# Patient Record
Sex: Male | Born: 2000
Health system: Southern US, Community
[De-identification: ages and names within clinical notes are randomized; demographics above are authoritative.]

## PROBLEM LIST (undated history)

## (undated) DIAGNOSIS — E119 Type 2 diabetes mellitus without complications: Secondary | ICD-10-CM

## (undated) DIAGNOSIS — K219 Gastro-esophageal reflux disease without esophagitis: Secondary | ICD-10-CM

## (undated) DIAGNOSIS — I10 Essential (primary) hypertension: Secondary | ICD-10-CM

## (undated) DIAGNOSIS — F909 Attention-deficit hyperactivity disorder, unspecified type: Secondary | ICD-10-CM

## (undated) DIAGNOSIS — K760 Fatty (change of) liver, not elsewhere classified: Secondary | ICD-10-CM

## (undated) HISTORY — DX: Gastro-esophageal reflux disease without esophagitis: K21.9

## (undated) HISTORY — PX: TONSILLECTOMY: SUR1361

---
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
## 2019-02-08 ENCOUNTER — Other Ambulatory Visit: Payer: Self-pay

## 2019-02-08 ENCOUNTER — Emergency Department (HOSPITAL_BASED_OUTPATIENT_CLINIC_OR_DEPARTMENT_OTHER)
Admission: EM | Admit: 2019-02-08 | Discharge: 2019-02-08 | Disposition: A | Discharge status: Home / Self Care | Payer: 59 | Attending: Emergency Medicine | Admitting: Emergency Medicine

## 2019-02-08 ENCOUNTER — Emergency Department (HOSPITAL_BASED_OUTPATIENT_CLINIC_OR_DEPARTMENT_OTHER): Payer: 59

## 2019-02-08 ENCOUNTER — Encounter (HOSPITAL_BASED_OUTPATIENT_CLINIC_OR_DEPARTMENT_OTHER): Payer: Self-pay

## 2019-02-08 DIAGNOSIS — R630 Anorexia: Secondary | ICD-10-CM | POA: Insufficient documentation

## 2019-02-08 DIAGNOSIS — R197 Diarrhea, unspecified: Secondary | ICD-10-CM | POA: Diagnosis not present

## 2019-02-08 DIAGNOSIS — M549 Dorsalgia, unspecified: Secondary | ICD-10-CM | POA: Diagnosis not present

## 2019-02-08 DIAGNOSIS — Z7984 Long term (current) use of oral hypoglycemic drugs: Secondary | ICD-10-CM | POA: Diagnosis not present

## 2019-02-08 DIAGNOSIS — R300 Dysuria: Secondary | ICD-10-CM | POA: Insufficient documentation

## 2019-02-08 DIAGNOSIS — E119 Type 2 diabetes mellitus without complications: Secondary | ICD-10-CM | POA: Insufficient documentation

## 2019-02-08 DIAGNOSIS — R112 Nausea with vomiting, unspecified: Secondary | ICD-10-CM | POA: Diagnosis not present

## 2019-02-08 DIAGNOSIS — R1011 Right upper quadrant pain: Secondary | ICD-10-CM | POA: Insufficient documentation

## 2019-02-08 DIAGNOSIS — R1010 Upper abdominal pain, unspecified: Secondary | ICD-10-CM

## 2019-02-08 HISTORY — DX: Fatty (change of) liver, not elsewhere classified: K76.0

## 2019-02-08 HISTORY — DX: Morbid (severe) obesity due to excess calories: E66.01

## 2019-02-08 LAB — COMPREHENSIVE METABOLIC PANEL
ALT: 138 U/L — ABNORMAL HIGH (ref 0–44)
AST: 60 U/L — ABNORMAL HIGH (ref 15–41)
Albumin: 4 g/dL (ref 3.5–5.0)
Alkaline Phosphatase: 73 U/L (ref 38–126)
Anion gap: 10 (ref 5–15)
BUN: 12 mg/dL (ref 6–20)
CO2: 21 mmol/L — ABNORMAL LOW (ref 22–32)
Calcium: 9.1 mg/dL (ref 8.9–10.3)
Chloride: 102 mmol/L (ref 98–111)
Creatinine, Ser: 0.65 mg/dL (ref 0.61–1.24)
GFR calc Af Amer: >60 mL/min (ref >60)
GFR calc non Af Amer: >60 mL/min (ref >60)
Glucose, Bld: 282 mg/dL — ABNORMAL HIGH (ref 70–99)
Potassium: 4.1 mmol/L (ref 3.5–5.1)
Sodium: 133 mmol/L — ABNORMAL LOW (ref 135–145)
Total Bilirubin: 0.3 mg/dL (ref 0.3–1.2)
Total Protein: 6.9 g/dL (ref 6.5–8.1)

## 2019-02-08 LAB — URINALYSIS, ROUTINE W REFLEX MICROSCOPIC
Bilirubin Urine: NEGATIVE
Glucose, UA: >=500 mg/dL — AB
Hgb urine dipstick: NEGATIVE
Ketones, ur: NEGATIVE mg/dL
Leukocytes,Ua: NEGATIVE
Nitrite: NEGATIVE
Protein, ur: NEGATIVE mg/dL
Specific Gravity, Urine: 1.02 (ref 1.005–1.030)
pH: 7 (ref 5.0–8.0)

## 2019-02-08 LAB — CBC WITH DIFFERENTIAL/PLATELET
Abs Immature Granulocytes: 0.03 10*3/uL (ref 0.00–0.07)
Basophils Absolute: 0 10*3/uL (ref 0.0–0.1)
Basophils Relative: 0 %
Eosinophils Absolute: 0 10*3/uL (ref 0.0–0.5)
Eosinophils Relative: 0 %
HCT: 44 % (ref 39.0–52.0)
Hemoglobin: 14.2 g/dL (ref 13.0–17.0)
Immature Granulocytes: 0 %
Lymphocytes Relative: 23 %
Lymphs Abs: 2 10*3/uL (ref 0.7–4.0)
MCH: 27 pg (ref 26.0–34.0)
MCHC: 32.3 g/dL (ref 30.0–36.0)
MCV: 83.8 fL (ref 80.0–100.0)
Monocytes Absolute: 0.5 10*3/uL (ref 0.1–1.0)
Monocytes Relative: 6 %
Neutro Abs: 6.2 10*3/uL (ref 1.7–7.7)
Neutrophils Relative %: 71 %
Platelets: 225 10*3/uL (ref 150–400)
RBC: 5.25 MIL/uL (ref 4.22–5.81)
RDW: 12.3 % (ref 11.5–15.5)
WBC: 8.8 10*3/uL (ref 4.0–10.5)
nRBC: 0 % (ref 0.0–0.2)

## 2019-02-08 LAB — URINALYSIS, MICROSCOPIC (REFLEX)
RBC / HPF: NONE SEEN RBC/hpf (ref 0–5)
WBC, UA: NONE SEEN WBC/hpf (ref 0–5)

## 2019-02-08 LAB — LIPASE, BLOOD: Lipase: 37 U/L (ref 11–51)

## 2019-02-08 MED ORDER — ONDANSETRON HCL 4 MG/2ML IJ SOLN
4.00 | INTRAMUSCULAR | Status: DC
Start: ? — End: 2019-02-08

## 2019-02-08 MED ORDER — SODIUM CHLORIDE 0.9 % IV BOLUS
1000.0000 mL | Freq: Once | INTRAVENOUS | Status: AC
  Administered 2019-02-08: 1000 mL via INTRAVENOUS

## 2019-02-08 MED ORDER — ALUM & MAG HYDROXIDE-SIMETH 200-200-20 MG/5ML PO SUSP
30.0000 mL | Freq: Once | ORAL | Status: AC
  Administered 2019-02-08: 30 mL via ORAL
  Filled 2019-02-08: qty 30

## 2019-02-08 MED ORDER — LIDOCAINE VISCOUS HCL 2 % MT SOLN
15.0000 mL | Freq: Once | OROMUCOSAL | Status: AC
  Administered 2019-02-08: 15 mL via ORAL
  Filled 2019-02-08: qty 15

## 2019-02-08 MED ORDER — SUCRALFATE 1 G PO TABS: 1.0000 g | ORAL_TABLET | Freq: Three times a day (TID) | ORAL | 0 refills | Status: AC

## 2019-02-08 MED ORDER — MORPHINE SULFATE (PF) 4 MG/ML IV SOLN
4.0000 mg | Freq: Once | INTRAVENOUS | Status: AC
  Administered 2019-02-08: 4 mg via INTRAVENOUS
  Filled 2019-02-08: qty 1

## 2019-02-08 MED ORDER — MORPHINE SULFATE 4 MG/ML IJ SOLN
4.00 | INTRAMUSCULAR | Status: DC
Start: ? — End: 2019-02-08

## 2019-02-08 MED ORDER — OMEPRAZOLE 20 MG PO CPDR: 20.0000 mg | DELAYED_RELEASE_CAPSULE | Freq: Every day | ORAL | 0 refills | Status: AC

## 2019-02-08 MED ORDER — ONDANSETRON HCL 4 MG/2ML IJ SOLN
4.0000 mg | Freq: Once | INTRAMUSCULAR | Status: AC
  Administered 2019-02-08: 4 mg via INTRAVENOUS
  Filled 2019-02-08: qty 2

## 2019-02-08 NOTE — ED Notes (Signed)
Pt stated that HPR did not do anything, but pt had a full set of blood work, urine, and a CT abdomen/pelvis

## 2019-02-08 NOTE — ED Notes (Signed)
Pt returned from US

## 2019-02-08 NOTE — ED Notes (Signed)
Pt verbalizes understanding of d/c instructions and denies any further needs at this time. 

## 2019-02-08 NOTE — ED Triage Notes (Signed)
Pt c/o lower abdominal pain since Friday, was seen at [facility] Sunday and ruled out for a kidney stone, states the pain is worse and is radiating to epigastric area and RUQ, with nausea and burning with urination. No flank tenderness, no fevers, pt has tried ibuprofen without relief.

## 2019-02-08 NOTE — ED Notes (Signed)
Pt did not get the prescription for zofran filled from his visit at [facility]

## 2019-02-08 NOTE — ED Notes (Signed)
Pt unable to provide urine at this time. RN Judson Roch informed.

## 2019-02-08 NOTE — Discharge Instructions (Signed)
Today you had lab work that showed a normal white blood cell count and pancreas level.  You did have mild elevation of your liver test which were unchanged from 5 years ago.  Today on your ultrasound it did show that you had a small amount of sludge in your gallbladder but no signs of infection or gallstones that were stuck.  Your CAT scan from 2 days ago showed no signs of appendicitis, intestinal issues or blockage.  Your urine today did not show signs of infection but a culture was done.  The pain you have today could be related to ulcers from taking ibuprofen.  I recommend you stop taking the ibuprofen and start on the stomach medication.  However the pain also may be related to a gallbladder that is not functioning properly.  Either way it is very important for you to follow-up with your doctor as you may need to see a specialist called a gastroenterologist.  They would be able to take a camera down and look into your stomach to see what is going on.  Ulcers do not show up on CAT scans or ultrasounds.  If in the meantime your symptoms start worsening and you start having persistent vomiting, blood in your vomit, fever or other concerns please return.

## 2019-02-08 NOTE — ED Provider Notes (Signed)
[facility] EMERGENCY DEPARTMENT Provider Note   CSN: [id number] Arrival date & time: 02/08/19  1751    History   Chief Complaint Chief Complaint  Patient presents with  . Abdominal Pain    HPI Jonathan Morris is a 18 y.o. male.     Patient is an 18 year old male with a history of obesity, type 2 diabetes presenting today with worsening abdominal pain.  Patient states approximately 5 days prior to arrival on Friday evening he started having pain in the periumbilical region.  At the same time that he got the pain he also complained of some dysuria and the pain would occasionally shoot to his right lower quadrant.  He describes the pain is intermittent and seemed to get worse after eating or with certain movements.  He states on Saturday he tried to take some ibuprofen for the pain and made him nauseated and he vomited one time.  Because the pain was not improved by Sunday he went to the [facility] emergency room.  They did blood work and a CT scan.  He had mild leukocytosis, mild elevation of his LFTs, normal lipase and normal UA.  His CT scan showed a fatty liver but no other acute findings.  Patient states they gave him some medication which seems to relieve the pain but did not tell him what was wrong.  His pain returned on Monday and started to get worse.  Today the pain has been severe still in the periumbilical region but now shoots into his right upper quadrant and he intermittently feels pain in his upper back.  He has no fever, cough or shortness of breath.  He is still having pain with urinating but denies testicular pain or swelling.  He has no pain in the shaft or end of his penis.  He was last sexually active in January and has had no discharge.  He has no testicular swelling or pain.  He has no prior history of abdominal pain or abdominal surgeries.  But because the pain was no better today he return for repeat evaluation.  The history is provided by the  patient.  Abdominal Pain  Pain location:  Periumbilical and RUQ Pain quality: aching, cramping, gnawing and shooting   Pain radiation: upper back. Pain severity:  Severe Onset quality:  Gradual Duration:  5 days Timing:  Intermittent Progression:  Worsening Chronicity:  New Context: not alcohol use, not diet changes, not previous surgeries, not recent illness, not recent sexual activity, not sick contacts and not suspicious food intake   Relieved by:  Nothing Worsened by:  Eating, movement and urination Ineffective treatments:  NSAIDs Associated symptoms: anorexia, diarrhea, dysuria, nausea and vomiting   Associated symptoms: no chest pain, no chills, no cough and no hematuria   Risk factors: obesity   Risk factors: no alcohol abuse, has not had multiple surgeries and no recent hospitalization     Past Medical History:  Diagnosis Date  . ADHD (attention deficit hyperactivity disorder)   . Diabetes mellitus without complication ([place])     There are no active problems to display for this patient.   Past Surgical History:  Procedure Laterality Date  . TONSILLECTOMY          Home Medications    Prior to Admission medications   Medication Sig Start Date End Date Taking? Authorizing Provider  metFORMIN (GLUCOPHAGE) 1000 MG tablet Take 1,000 mg by mouth 2 (two) times daily with a meal.    [provider]  ondansetron (ZOFRAN) 4 MG tablet Take 1 tablet (4 mg total) by mouth every 6 (six) hours. 12/15/16   Law, Bea Graff, PA-C  oseltamivir (TAMIFLU) 75 MG capsule Take 1 capsule (75 mg total) by mouth every 12 (twelve) hours. 12/15/16   Frederica Kuster, PA-C    Family History No family history on file.  Social History Social History   Tobacco Use  . Smoking status: Never Smoker  . Smokeless tobacco: Never Used  Substance Use Topics  . Alcohol use: No  . Drug use: Not on file     Allergies   Patient has no known allergies.   Review of Systems Review of  Systems  Constitutional: Negative for chills.  Respiratory: Negative for cough.   Cardiovascular: Negative for chest pain.  Gastrointestinal: Positive for abdominal pain, anorexia, diarrhea, nausea and vomiting.  Genitourinary: Positive for dysuria. Negative for hematuria.  All other systems reviewed and are negative.    Physical Exam Updated Vital Signs BP 139/85 (BP Location: Right Arm)   Pulse 92   Temp 98.2 F (36.8 C) (Oral)   Resp 14   Ht 6' 1"  (1.854 m)   Wt (!) 165.6 kg   SpO2 99%   BMI 48.16 kg/m   Physical Exam Vitals signs and nursing note reviewed.  Constitutional:      General: He is not in acute distress.    Appearance: He is well-developed. He is obese.  HENT:     Head: Normocephalic and atraumatic.  Eyes:     Conjunctiva/sclera: Conjunctivae normal.     Pupils: Pupils are equal, round, and reactive to light.  Neck:     Musculoskeletal: Normal range of motion and neck supple.  Cardiovascular:     Rate and Rhythm: Normal rate and regular rhythm.     Heart sounds: No murmur.  Pulmonary:     Effort: Pulmonary effort is normal. No respiratory distress.     Breath sounds: Normal breath sounds. No wheezing or rales.  Abdominal:     General: Bowel sounds are normal. There is no distension.     Palpations: Abdomen is soft.     Tenderness: There is abdominal tenderness in the right upper quadrant and epigastric area. There is guarding. There is no rebound. Positive signs include Murphy's sign.     Hernia: No hernia is present. There is no hernia in the umbilical area.  Genitourinary:    Penis: Normal.      Scrotum/Testes: Normal.  Musculoskeletal: Normal range of motion.        General: No tenderness.  Skin:    General: Skin is warm and dry.     Findings: No erythema or rash.  Neurological:     General: No focal deficit present.     Mental Status: He is alert and oriented to person, place, and time.  Psychiatric:        Mood and Affect: Mood normal.         Behavior: Behavior normal.      ED Treatments / Results  Labs (all labs ordered are listed, but only abnormal results are displayed) Labs Reviewed  COMPREHENSIVE METABOLIC PANEL - Abnormal; Notable for the following components:      Result Value   Sodium 133 (*)    CO2 21 (*)    Glucose, Bld 282 (*)    AST 60 (*)    ALT 138 (*)    All other components within normal limits  URINALYSIS, ROUTINE  W REFLEX MICROSCOPIC - Abnormal; Notable for the following components:   Glucose, UA >=500 (*)    All other components within normal limits  URINALYSIS, MICROSCOPIC (REFLEX) - Abnormal; Notable for the following components:   Bacteria, UA RARE (*)    All other components within normal limits  CBC WITH DIFFERENTIAL/PLATELET  LIPASE, BLOOD    EKG None  Radiology US Abdomen Limited  Result Date: 02/08/2019 CLINICAL DATA:  18 year old male with right upper quadrant abdominal pain. EXAM: ULTRASOUND ABDOMEN LIMITED RIGHT UPPER QUADRANT COMPARISON:  CT of the abdomen pelvis dated 02/06/2019 FINDINGS: Gallbladder: Small amount of sludge within the gallbladder. No gallbladder wall thickening or pericholecystic fluid. Common bile duct: Diameter: 3 mm Liver: There is diffuse increased liver echogenicity most consistent with fatty infiltration. Portal vein is patent on color Doppler imaging with normal direction of blood flow towards the liver. IMPRESSION: 1. Small gallbladder sludge. No sonographic findings of acute cholecystitis. 2. Fatty liver. Electronically Signed   By: Anner Crete M.D.   On: 02/08/2019 19:56    Procedures Procedures (including critical care time)  Medications Ordered in ED Medications  sodium chloride 0.9 % bolus 1,000 mL (has no administration in time range)  morphine 4 MG/ML injection 4 mg (has no administration in time range)  ondansetron (ZOFRAN) injection 4 mg (has no administration in time range)     Initial Impression / Assessment and Plan / ED Course  I  have reviewed the triage vital signs and the nursing notes.  Pertinent labs & imaging results that were available during my care of the patient were reviewed by me and considered in my medical decision making (see chart for details).       18 year old male returning today with persistent abdominal pain that is worsening.  Pain is now been present for 5 days and is migrating now from the periumbilical region to the right upper quadrant.  Patient seen 2 days ago at [facility] and at that time had mild transaminitis and mild leukocytosis.  However CT showed no acute findings.  Patient is well-appearing here with normal vital signs but does have significant tenderness in his right upper quadrant.  Concern for possible gallbladder pathology that could be the cause of his elevated LFTs and normal CT.  Lower suspicion for appendicitis at this time because patient has no significant right lower quadrant pain.  Unclear why patient is having dysuria as he is not currently sexually active and low suspicion for STI.  He also does not get frequent UTIs.  UA from 2 days ago was normal.  He has no testicular swelling or masses.  He denies any upper respiratory symptoms.  We will repeat labs and do a right upper quadrant ultrasound.  Patient given morphine and Zofran  8:47 PM Patient's labs show a normal white count, persistent elevated LFTs but similar to 5 years ago which this appears to be chronic related to his fatty liver disease.  UA with glucose but no other acute findings.  Lipase within normal limits.  Ultrasound shows some sludge in the gallbladder but no evidence concerning for cholecystitis.  It is unclear if this is the cause of his pain.  Also when speaking with the patient he takes ibuprofen 3-4 times a week and has been taking it daily since the pain occurred.  Also question whether he has peptic ulcer disease.  Patient had some improvement with morphine and was given a GI cocktail.  Will start on  a PPI  but stressed to him the importance of following up with his PCP and the most likely GI for further evaluation.  He was given strict return precautions.  Final Clinical Impressions(s) / ED Diagnoses   Final diagnoses:  RUQ pain  Upper abdominal pain of unknown etiology    ED Discharge Orders         Ordered    sucralfate (CARAFATE) 1 g tablet  3 times daily with meals & bedtime     02/08/19 2050    omeprazole (PRILOSEC) 20 MG capsule  Daily     02/08/19 2050           Blanchie Dessert, MD 02/08/19 2050

## 2019-02-08 NOTE — ED Notes (Signed)
Patient transported to Ultrasound 

## 2019-02-10 ENCOUNTER — Other Ambulatory Visit: Payer: Self-pay

## 2019-02-10 ENCOUNTER — Encounter: Payer: Self-pay | Admitting: Gastroenterology

## 2019-02-10 ENCOUNTER — Telehealth (INDEPENDENT_AMBULATORY_CARE_PROVIDER_SITE_OTHER): Payer: 59 | Admitting: Gastroenterology

## 2019-02-10 VITALS — Ht 73.0 in | Wt 360.0 lb

## 2019-02-10 DIAGNOSIS — R1011 Right upper quadrant pain: Secondary | ICD-10-CM | POA: Diagnosis not present

## 2019-02-10 DIAGNOSIS — K760 Fatty (change of) liver, not elsewhere classified: Secondary | ICD-10-CM

## 2019-02-10 MED ORDER — PANTOPRAZOLE SODIUM 40 MG PO TBEC: 40.0000 mg | DELAYED_RELEASE_TABLET | Freq: Every day | ORAL | 6 refills | Status: AC

## 2019-02-10 NOTE — Patient Instructions (Signed)
If you are age 19 or older, your body mass index should be between 23-30. Your Body mass index is 47.5 kg/m. If this is out of the aforementioned range listed, please consider follow up with your Primary Care Provider.  If you are age 75 or younger, your body mass index should be between 19-25. Your Body mass index is 47.5 kg/m. If this is out of the aformentioned range listed, please consider follow up with your Primary Care Provider.   You have been scheduled for an endoscopy. Please follow written instructions given to you at your visit today. If you use inhalers (even only as needed), please bring them with you on the day of your procedure. Your physician has requested that you go to www.startemmi.com and enter the access code given to you at your visit today. This web site gives a general overview about your procedure. However, you should still follow specific instructions given to you by our office regarding your preparation for the procedure.  To help prevent the possible spread of infection to our patients, communities, and staff; we will be implementing the following measures:  As of now we are not allowing any visitors/family members to accompany you to any upcoming appointments with [facility]. If you have any concerns about this please contact our office to discuss prior to the appointment.   Please go to the lab on the 2nd floor [address] for your scheduled appointment.   Avoid NSAIDS.  Gradually try to reduce your weight.   Follow up in 12 weeks.   Thank you,  Dr. Jackquline Denmark

## 2019-02-10 NOTE — Progress Notes (Signed)
Chief Complaint: Abdominal pain  Referring Provider:  [facility] [place];   #1. RUQ abdominal pain. [place] 01/2019 showing GB sludge, fatty liver. CT 02/06/2019- neg except fatty liver. Nl lipase.  #2. Abn LFTs likely d/t fatty liver. R/O other causes. Has assoc DM2, obesity. No ETOH.  Plan: -Change omeprazole to Protonix 40 mg p.o. QD, 1/2 hr before BF (#30, 6 refills) -Avoid nonsteroidals. -Proceed with EGD with SB Bx. -Encouraged him to gradually reduce weight as the only treatment for fatty liver.  He has lost 10lb over last 3 months.  Aim is to lose 10lb  more over the next 3 months.  Stop fried foods/sodas/foods with high fructose corn syrup.  Start exercising gradually. - Check acute viral hepatitis, AMA, anti-smooth muscle antibody and anti-LKM, iron studies,S. ceruloplasmin, alpha-1 antitrypsin, celiac screen and GGT. Check anti-HAV total antibody and anti-hepatitis B surface antibody titer.  If negative, would recommend vaccination for hepatitis A and B. -Best possible control of diabetes. -FU in 12 weeks.  If continued abn LFTs, hepatic elastography/liver Bx.    HPI:    Jonathan Morris is a 18 y.o. male  2 visits to the ED, last ED visit 0/05/3817 With periumbilical and right upper quadrant abdominal pain x 1 week -moderately severe, intermittent, nonradiating, worse after eating, associated nausea/intermittent vomiting. Has taken ibuprofen without any relief. Has been having heartburn No fever chills night sweats.  Found to have abnormal LFTs (same as baseline in 2015), normal UA, lipase.  Underwent ultrasound and CT Abdo/pelvis-showed fatty liver, gallbladder sludge.  No other abnormalities.  Advised GI visit for possible endoscopy.  Has occasional diarrhea ever since he has been on metformin. Dx with diabetes at the age 37.  Lately has lost 10 pounds over the last 3 months.  He has been trying to lose weight.  No history of  itching, skin lesions, easy bruisability, intake of over-the-counter medications including diet pills, herbal medications, anabolic steroids or Tylenol.  There is no history of blood transfusions, IV drug use.   Has family history of liver cirrhosis( grandfather)but he did drink alcohol..  No jaundice dark urine or pale stools.    No history of alcohol abuse.    Past Medical History:  Diagnosis Date  . ADHD (attention deficit hyperactivity disorder)   . Diabetes mellitus without complication ([place])   . Fatty liver   . Morbid obesity ([place])     Past Surgical History:  Procedure Laterality Date  . TONSILLECTOMY      Family History  Family history unknown: Yes    Social History   Tobacco Use  . Smoking status: Never Smoker  . Smokeless tobacco: Never Used  Substance Use Topics  . Alcohol use: No  . Drug use: Never    Current Outpatient Medications  Medication Sig Dispense Refill  . FLUoxetine (PROZAC) 10 MG tablet Take 1 tablet by mouth daily.    . metFORMIN (GLUCOPHAGE) 1000 MG tablet Take 1,000 mg by mouth 2 (two) times daily with a meal.    . omeprazole (PRILOSEC) 20 MG capsule Take 1 capsule (20 mg total) by mouth daily. 30 capsule 0  . ondansetron (ZOFRAN) 4 MG tablet Take 1 tablet (4 mg total) by mouth every 6 (six) hours. 12 tablet 0  . sucralfate (CARAFATE) 1 g tablet Take 1 tablet (1 g total) by mouth 4 (four) times daily -  with meals and at bedtime. 90 tablet 0  .  traZODone (DESYREL) 50 MG tablet Take 1 tablet by mouth daily.     No current facility-administered medications for this visit.     No Known Allergies  Review of Systems:  Constitutional: Denies fever, chills, diaphoresis, appetite change and fatigue.  HEENT: Denies photophobia, eye pain, redness, hearing loss, ear pain, congestion, sore throat, rhinorrhea, sneezing, mouth sores, neck pain, neck stiffness and tinnitus.   Respiratory: Denies SOB, DOE, cough, chest tightness,  and wheezing.    Cardiovascular: Denies chest pain, palpitations and leg swelling.  Genitourinary: Denies dysuria, urgency, frequency, hematuria, flank pain and difficulty urinating.  Musculoskeletal: Denies myalgias, back pain, joint swelling, arthralgias and gait problem.  Skin: No rash.  Neurological: Denies dizziness, seizures, syncope, weakness, light-headedness, numbness and headaches.  Hematological: Denies adenopathy. Easy bruising, personal or family bleeding history  Psychiatric/Behavioral: Has anxiety or depression     Physical Exam:    Ht 6' 1"  (1.854 m)   Wt (!) 360 lb (163.3 kg)   BMI 47.50 kg/m  Filed Weights   02/10/19 0850  Weight: (!) 360 lb (163.3 kg)   Constitutional:  Well-developed, in no acute distress. Psychiatric: Normal mood and affect. Behavior is normal. Doxy visit  Data Reviewed: I have personally reviewed following labs and imaging studies  CBC: CBC Latest Ref Rng & Units 02/08/2019 12/15/2016 10/25/2013  WBC 4.0 - 10.5 K/uL 8.8 7.0 8.8  Hemoglobin 13.0 - 17.0 g/dL 14.2 14.2 13.9  Hematocrit 39.0 - 52.0 % 44.0 43.0 40.0  Platelets 150 - 400 K/uL 225 163 236    CMP: CMP Latest Ref Rng & Units 02/08/2019 12/15/2016 10/25/2013  Glucose 70 - 99 mg/dL 282(H) 160(H) 126(H)  BUN 6 - 20 mg/dL 12 11 13   Creatinine 0.61 - 1.24 mg/dL 0.65 0.87 0.64  Sodium 135 - 145 mmol/L 133(L) 137 140  Potassium 3.5 - 5.1 mmol/L 4.1 3.9 4.2  Chloride 98 - 111 mmol/L 102 105 101  CO2 22 - 32 mmol/L 21(L) 24 23  Calcium 8.9 - 10.3 mg/dL 9.1 8.9 9.5  Total Protein 6.5 - 8.1 g/dL 6.9 - 7.4  Total Bilirubin 0.3 - 1.2 mg/dL 0.3 - <0.2(L)  Alkaline Phos 38 - 126 U/L 73 - 233  AST 15 - 41 U/L 60(H) - 60(H)  ALT 0 - 44 U/L 138(H) - 168(H)    GFR: Estimated Creatinine Clearance: 240 mL/min (by C-G formula based on SCr of 0.65 mg/dL). Liver Function Tests: Recent Labs  Lab 02/08/19 1840  AST 60*  ALT 138*  ALKPHOS 73  BILITOT 0.3  PROT 6.9  ALBUMIN 4.0   Recent Labs  Lab 02/08/19  1840  LIPASE 37     Radiology Studies: US Abdomen Limited  Result Date: 02/08/2019 CLINICAL DATA:  19 year old male with right upper quadrant abdominal pain. EXAM: ULTRASOUND ABDOMEN LIMITED RIGHT UPPER QUADRANT COMPARISON:  CT of the abdomen pelvis dated 02/06/2019 FINDINGS: Gallbladder: Small amount of sludge within the gallbladder. No gallbladder wall thickening or pericholecystic fluid. Common bile duct: Diameter: 3 mm Liver: There is diffuse increased liver echogenicity most consistent with fatty infiltration. Portal vein is patent on color Doppler imaging with normal direction of blood flow towards the liver. IMPRESSION: 1. Small gallbladder sludge. No sonographic findings of acute cholecystitis. 2. Fatty liver. Electronically Signed   By: Anner Crete M.D.   On: 02/08/2019 19:56   This service was provided via telemedicine.  The patient was located at home.  The provider was located in office.  The patient did  consent to this Doxy- visit and is aware of possible charges through their insurance for this visit.  The patient was referred by ED/RH.    CT reviewed independently.  Time spent on call and coordination of care/review of records: 45 min    Carmell Austria, MD 02/10/2019, 9:57 AM  Cc: [facility]*

## 2019-02-10 NOTE — Addendum Note (Signed)
Addended by: Karena Addison on: 02/10/2019 11:02 AM   Modules accepted: Orders

## 2019-02-14 ENCOUNTER — Other Ambulatory Visit (INDEPENDENT_AMBULATORY_CARE_PROVIDER_SITE_OTHER): Payer: 59

## 2019-02-14 ENCOUNTER — Other Ambulatory Visit: Payer: Self-pay

## 2019-02-14 ENCOUNTER — Telehealth: Payer: Self-pay | Admitting: *Deleted

## 2019-02-14 DIAGNOSIS — K760 Fatty (change of) liver, not elsewhere classified: Secondary | ICD-10-CM | POA: Diagnosis not present

## 2019-02-14 DIAGNOSIS — R1011 Right upper quadrant pain: Secondary | ICD-10-CM

## 2019-02-14 LAB — FERRITIN: Ferritin: 145.9 ng/mL (ref 22.0–322.0)

## 2019-02-14 LAB — GAMMA GT: GGT: 46 U/L (ref 7–51)

## 2019-02-14 NOTE — Telephone Encounter (Signed)
Covid-19 travel screening questions  Have you traveled in the last 14 days?no If yes where?  Do you now or have you had a fever in the last 14 days?no  Do you have any respiratory symptoms of shortness of breath or cough now or in the last 14 days?no  Do you have a medical history of Congestive Heart Failure?  Do you have a medical history of lung disease?  Do you have any family members or close contacts with diagnosed or suspected Covid-19?no  PT made aware of [facility] policy and will bring a mask with him. SM

## 2019-02-16 ENCOUNTER — Other Ambulatory Visit: Payer: Self-pay

## 2019-02-16 ENCOUNTER — Ambulatory Visit (AMBULATORY_SURGERY_CENTER): Payer: 59 | Admitting: Gastroenterology

## 2019-02-16 VITALS — BP 122/67 | HR 82 | Temp 97.4°F | Resp 17

## 2019-02-16 DIAGNOSIS — K2970 Gastritis, unspecified, without bleeding: Secondary | ICD-10-CM | POA: Diagnosis not present

## 2019-02-16 DIAGNOSIS — R1011 Right upper quadrant pain: Secondary | ICD-10-CM

## 2019-02-16 LAB — CELIAC PANEL 10
Antigliadin Abs, IgA: 4 units (ref 0–19)
Endomysial IgA: NEGATIVE
Gliadin IgG: 2 units (ref 0–19)
IgA/Immunoglobulin A, Serum: 190 mg/dL (ref 90–386)
Tissue Transglut Ab: <2 U/mL (ref 0–5)
Transglutaminase IgA: <2 U/mL (ref 0–3)

## 2019-02-16 MED ORDER — SODIUM CHLORIDE 0.9 % IV SOLN: 500.0000 mL | Freq: Once | INTRAVENOUS | Status: DC

## 2019-02-16 NOTE — Op Note (Signed)
[facility] Patient Name: Jonathan Morris Procedure Date: 02/16/2019 10:24 AM MRN: [id number] Endoscopist: Jackquline Denmark , MD Age: 18 Referring MD:  Date of Birth: 2001-05-21 Gender: Male Account #: [id number] Procedure:                Upper GI endoscopy Indications:              Abdominal pain in the right upper quadrant Medicines:                Monitored Anesthesia Care Procedure:                Pre-Anesthesia Assessment:                           - Prior to the procedure, a History and Physical                            was performed, and patient medications and                            allergies were reviewed. The patient's tolerance of                            previous anesthesia was also reviewed. The risks                            and benefits of the procedure and the sedation                            options and risks were discussed with the patient.                            All questions were answered, and informed consent                            was obtained. Prior Anticoagulants: The patient has                            taken no previous anticoagulant or antiplatelet                            agents. ASA Grade Assessment: II - A patient with                            mild systemic disease. After reviewing the risks                            and benefits, the patient was deemed in                            satisfactory condition to undergo the procedure.                           After obtaining informed consent, the endoscope was  passed under direct vision. Throughout the                            procedure, the patient's blood pressure, pulse, and                            oxygen saturations were monitored continuously. The                            Endoscope was introduced through the mouth, and                            advanced to the second part of duodenum. The upper                            GI  endoscopy was accomplished without difficulty.                            The patient tolerated the procedure well. Scope In: Scope Out: Findings:                 The examined esophagus was normal. Incidental note                            was made of 2 inlet patches in the proximal                            esophagus.                           The Z-line was regular and was found 40 cm from the                            incisors.                           Localized mild inflammation characterized by                            erythema was found in the gastric antrum. Biopsies                            were taken with a cold forceps for histology.                            Estimated blood loss: none.                           The examined duodenum was normal. Biopsies for                            histology were taken with a cold forceps for                            evaluation of celiac disease. Estimated blood  loss:                            none. Complications:            No immediate complications. Estimated Blood Loss:     Estimated blood loss: none. Impression:               - Mild gastritis.                           - Otherwise normal EGD. Recommendation:           - Patient has a contact number available for                            emergencies. The signs and symptoms of potential                            delayed complications were discussed with the                            patient. Return to normal activities tomorrow.                            Written discharge instructions were provided to the                            patient.                           - Resume previous low fat diet.                           - Continue Protonix 40 mg p.o. once a day.                           - Await pathology results.                           - Return to GI clinic ([facility]) in 6 weeks. Jackquline Denmark, MD 02/16/2019 10:59:08 AM This report has been signed  electronically.

## 2019-02-16 NOTE — Patient Instructions (Addendum)
Handout given for gastritis. Continue protonix.   YOU HAD AN ENDOSCOPIC PROCEDURE TODAY AT [facility]:   Refer to the procedure report that was given to you for any specific questions about what was found during the examination.  If the procedure report does not answer your questions, please call your gastroenterologist to clarify.  If you requested that your care partner not be given the details of your procedure findings, then the procedure report has been included in a sealed envelope for you to review at your convenience later.  YOU SHOULD EXPECT: Some feelings of bloating in the abdomen. Passage of more gas than usual.  Walking can help get rid of the air that was put into your GI tract during the procedure and reduce the bloating. If you had a lower endoscopy (such as a colonoscopy or flexible sigmoidoscopy) you may notice spotting of blood in your stool or on the toilet paper. If you underwent a bowel prep for your procedure, you may not have a normal bowel movement for a few days.  Please Note:  You might notice some irritation and congestion in your nose or some drainage.  This is from the oxygen used during your procedure.  There is no need for concern and it should clear up in a day or so.  SYMPTOMS TO REPORT IMMEDIATELY:    Following upper endoscopy (EGD)  Vomiting of blood or coffee ground material  New chest pain or pain under the shoulder blades  Painful or persistently difficult swallowing  New shortness of breath  Fever of 100F or higher  Black, tarry-looking stools  For urgent or emergent issues, a gastroenterologist can be reached at any hour by calling [phone].   DIET:  We do recommend a small meal at first, but then you may proceed to your regular diet.  Drink plenty of fluids but you should avoid alcoholic beverages for 24 hours.  ACTIVITY:  You should plan to take it easy for the rest of today and you should NOT DRIVE or use heavy machinery  until tomorrow (because of the sedation medicines used during the test).    FOLLOW UP: Our staff will call the number listed on your records 48-72 hours following your procedure to check on you and address any questions or concerns that you may have regarding the information given to you following your procedure. If we do not reach you, we will leave a message.  We will attempt to reach you two times.  During this call, we will ask if you have developed any symptoms of COVID 19. If you develop any symptoms (for example fever, flu-like symptoms, shortness of breath, cough etc.) before then, please call [phone].  If any biopsies were taken you will be contacted by phone or by letter within the next 1-3 weeks.  Please call us at [phone] if you have not heard about the biopsies in 3 weeks.    SIGNATURES/CONFIDENTIALITY: You and/or your care partner have signed paperwork which will be entered into your electronic medical record.  These signatures attest to the fact that that the information above on your After Visit Summary has been reviewed and is understood.  Full responsibility of the confidentiality of this discharge information lies with you and/or your care-partner.

## 2019-02-16 NOTE — Progress Notes (Signed)
Pt's states no medical or surgical changes since previsit or office visit. Jonathan Morris -temps,[name]-vital signs.

## 2019-02-16 NOTE — Progress Notes (Signed)
A/ox3, pleased with MAC, report to RN 

## 2019-02-16 NOTE — Progress Notes (Signed)
Called to room to assist during endoscopic procedure.  Patient ID and intended procedure confirmed with present staff. Received instructions for my participation in the procedure from the performing physician.  

## 2019-02-17 ENCOUNTER — Telehealth: Payer: Self-pay

## 2019-02-17 LAB — CERULOPLASMIN: Ceruloplasmin: 32 mg/dL (ref 20–45)

## 2019-02-17 LAB — HEPATITIS PANEL, ACUTE
Hep A IgM: NONREACTIVE
Hep B C IgM: NONREACTIVE
Hepatitis B Surface Ag: NONREACTIVE
Hepatitis C Ab: NONREACTIVE
SIGNAL TO CUT-OFF: 0 (ref <1.00)

## 2019-02-17 LAB — IRON, TOTAL/TOTAL IRON BINDING CAP
%SAT: 15 % (calc) — ABNORMAL LOW (ref 16–48)
Iron: 62 ug/dL (ref 27–164)
TIBC: 427 mcg/dL (calc) (ref 271–448)

## 2019-02-17 LAB — HEPATITIS B SURFACE ANTIBODY,QUALITATIVE: Hep B S Ab: NONREACTIVE

## 2019-02-17 LAB — ANTI-SMOOTH MUSCLE ANTIBODY, IGG: Actin (Smooth Muscle) Antibody (IGG): <20 U (ref <20)

## 2019-02-17 LAB — ALPHA-1-ANTITRYPSIN: A-1 Antitrypsin, Ser: 131 mg/dL (ref 83–199)

## 2019-02-17 LAB — ANTI-MICROSOMAL ANTIBODY LIVER / KIDNEY: LKM1 Ab: <=20 U (ref <=20.0)

## 2019-02-17 LAB — MITOCHONDRIAL ANTIBODIES: Mitochondrial M2 Ab, IgG: <=20 U

## 2019-02-17 LAB — HEPATITIS A ANTIBODY, TOTAL: Hepatitis A AB,Total: REACTIVE — AB

## 2019-02-17 NOTE — Telephone Encounter (Signed)
Patient scheduled for ZOOM visit, F/U from 02/16/19 EGD. Patient aware

## 2019-02-18 ENCOUNTER — Telehealth: Payer: Self-pay

## 2019-02-18 NOTE — Telephone Encounter (Signed)
  Follow up Call-  Call back number 02/16/2019  Post procedure Call Back phone  # [phone]  Permission to leave phone message Yes  Some recent data might be hidden     Patient questions:  Do you have a fever, pain , or abdominal swelling? No. Pain Score  0 *  Have you tolerated food without any problems? Yes.    Have you been able to return to your normal activities? Yes.    Do you have any questions about your discharge instructions: Diet   No. Medications  No. Follow up visit  No.  Do you have questions or concerns about your Care? No.  Actions: * If pain score is 4 or above: No action needed, pain <4.  1. Have you developed a fever since your procedure? no  2.   Have you had an respiratory symptoms (SOB or cough) since your procedure? no  3.   Have you tested positive for COVID 19 since your procedure no  3.   Have you had any family members/close contacts diagnosed with the COVID 19 since your procedure?  no   If any of these questions are a yes, please inquire if patient has been seen by family doctor and route this note to Joylene John, Therapist, sports.

## 2019-02-24 ENCOUNTER — Encounter: Payer: Self-pay | Admitting: Gastroenterology

## 2019-03-29 ENCOUNTER — Other Ambulatory Visit: Payer: Self-pay

## 2019-03-29 ENCOUNTER — Encounter: Payer: Self-pay | Admitting: Gastroenterology

## 2019-03-29 ENCOUNTER — Telehealth (INDEPENDENT_AMBULATORY_CARE_PROVIDER_SITE_OTHER): Payer: 59 | Admitting: Gastroenterology

## 2019-03-29 VITALS — Ht 73.0 in | Wt 350.0 lb

## 2019-03-29 DIAGNOSIS — K760 Fatty (change of) liver, not elsewhere classified: Secondary | ICD-10-CM | POA: Diagnosis not present

## 2019-03-29 NOTE — Patient Instructions (Signed)
If you are age 19 or older, your body mass index should be between 23-30. Your Body mass index is 46.18 kg/m. If this is out of the aforementioned range listed, please consider follow up with your Primary Care Provider.  If you are age 21 or younger, your body mass index should be between 19-25. Your Body mass index is 46.18 kg/m. If this is out of the aformentioned range listed, please consider follow up with your Primary Care Provider.   Please go to the lab at [facility] ([place] You will need to go to level "B", you do not need an appointment for this. Hours available are 7:30 am - 4:30 pm.   Continue to reduce weight gradually and exercise.   Follow up 12 weeks.  Thank you,  Dr. Jackquline Denmark

## 2019-03-29 NOTE — Progress Notes (Signed)
Chief Complaint: Abdominal pain  Referring Provider:  [facility] [place];   #1. RUQ abdominal pain. [place] 01/2019 showing GB sludge, fatty liver. CT 02/06/2019- neg except fatty liver. Nl lipase. Neg EGD with SB bx 01/2019  #2. Abn LFTs likely d/t fatty liver. Has assoc DM2, obesity. No ETOH. Neg acute viral hepatitis, AMA, ASMA, iron studies,S. ceruloplasmin, A1AT, celiac screen and GGT. Immune to A, getting vaccine for B.  Plan: -Continue Protonix 40 mg p.o. QD, 1/2 hr before BF (#30, 11 refills) -Check LFTs -Encouraged him to continue reduce weight as the only treatment for fatty liver.  He has lost 15-20 lb over last 6 months.  Encouraged him to have more grilled foods than fried foods.  Also encouraged him to start exercising. -If still with abnormal LFTs, would consider hepatic elastography. -Best possible control of diabetes. -FU in 12 weeks.     HPI:    Jonathan Morris is a 18 y.o. male  Doing much better. No significant abdominal pain. Would have heartburn if he misses a dose of Protonix. Has been working very hard.  Does not have much time to walk except for at work.  He does walk a lot at work. Has been watching what he eats. Has lost approximately 10 more pounds since the last visit. Getting hepatitis B vaccine.  No diarrhea or constipation.  No history of alcohol abuse.    Past Medical History:  Diagnosis Date  . ADHD (attention deficit hyperactivity disorder)   . Diabetes mellitus without complication ([place])   . Fatty liver   . Morbid obesity ([place])     Past Surgical History:  Procedure Laterality Date  . TONSILLECTOMY      Family History  Problem Relation Age of Onset  . Colon cancer Neg Hx     Social History   Tobacco Use  . Smoking status: Never Smoker  . Smokeless tobacco: Never Used  Substance Use Topics  . Alcohol use: No  . Drug use: Never    Current Outpatient Medications  Medication Sig  Dispense Refill  . FLUoxetine (PROZAC) 10 MG tablet Take 1 tablet by mouth daily.    . metFORMIN (GLUCOPHAGE) 1000 MG tablet Take 1,000 mg by mouth 2 (two) times daily with a meal.    . omeprazole (PRILOSEC) 20 MG capsule Take 1 capsule (20 mg total) by mouth daily. 30 capsule 0  . ondansetron (ZOFRAN) 4 MG tablet Take 1 tablet (4 mg total) by mouth every 6 (six) hours. 12 tablet 0  . pantoprazole (PROTONIX) 40 MG tablet Take 1 tablet (40 mg total) by mouth daily. 1/2 hour before breakfast. 30 tablet 6  . sucralfate (CARAFATE) 1 g tablet Take 1 tablet (1 g total) by mouth 4 (four) times daily -  with meals and at bedtime. 90 tablet 0  . traZODone (DESYREL) 50 MG tablet Take 1 tablet by mouth daily.     No current facility-administered medications for this visit.     No Known Allergies  Review of Systems:  neg     Physical Exam:    Ht 6' 1"  (1.854 m)   Wt (!) 350 lb (158.8 kg)   BMI 46.18 kg/m  Filed Weights   03/29/19 1133  Weight: (!) 350 lb (158.8 kg)  Not examined.  Data Reviewed: I have personally reviewed following labs and imaging studies  CBC: CBC Latest Ref Rng & Units 02/08/2019 12/15/2016 10/25/2013  WBC  4.0 - 10.5 K/uL 8.8 7.0 8.8  Hemoglobin 13.0 - 17.0 g/dL 14.2 14.2 13.9  Hematocrit 39.0 - 52.0 % 44.0 43.0 40.0  Platelets 150 - 400 K/uL 225 163 236    CMP: CMP Latest Ref Rng & Units 02/08/2019 12/15/2016 10/25/2013  Glucose 70 - 99 mg/dL 282(H) 160(H) 126(H)  BUN 6 - 20 mg/dL 12 11 13   Creatinine 0.61 - 1.24 mg/dL 0.65 0.87 0.64  Sodium 135 - 145 mmol/L 133(L) 137 140  Potassium 3.5 - 5.1 mmol/L 4.1 3.9 4.2  Chloride 98 - 111 mmol/L 102 105 101  CO2 22 - 32 mmol/L 21(L) 24 23  Calcium 8.9 - 10.3 mg/dL 9.1 8.9 9.5  Total Protein 6.5 - 8.1 g/dL 6.9 - 7.4  Total Bilirubin 0.3 - 1.2 mg/dL 0.3 - <0.2(L)  Alkaline Phos 38 - 126 U/L 73 - 233  AST 15 - 41 U/L 60(H) - 60(H)  ALT 0 - 44 U/L 138(H) - 168(H)     Radiology Studies: No results found. This service  was provided via telemedicine.  The patient was located at work.  The provider was located in office.  The patient did consent to this tele-visit and is aware of possible charges through their insurance for this visit.   Time spent on call and coordination of care/review of records: 15 min    Carmell Austria, MD 03/29/2019, 2:15 PM  Cc: [facility], [facility]*

## 2019-04-12 ENCOUNTER — Telehealth: Payer: Self-pay | Admitting: Gastroenterology

## 2019-04-12 NOTE — Telephone Encounter (Signed)
Called and spoke with patient-patient reports he is currently in the ER at [facility] due to the esophageal spasms being so bad; patient reports he is going to stay in the ER and be checked out and follow up with Dr. Lyndel Safe on 04/13/2019 at 11:20 am for issue;

## 2019-04-13 ENCOUNTER — Telehealth (INDEPENDENT_AMBULATORY_CARE_PROVIDER_SITE_OTHER): Payer: 59 | Admitting: Gastroenterology

## 2019-04-13 ENCOUNTER — Other Ambulatory Visit: Payer: Self-pay

## 2019-04-13 ENCOUNTER — Encounter: Payer: Self-pay | Admitting: Gastroenterology

## 2019-04-13 VITALS — Ht 73.0 in | Wt 350.0 lb

## 2019-04-13 DIAGNOSIS — R1011 Right upper quadrant pain: Secondary | ICD-10-CM

## 2019-04-13 DIAGNOSIS — R7989 Other specified abnormal findings of blood chemistry: Secondary | ICD-10-CM

## 2019-04-13 DIAGNOSIS — R945 Abnormal results of liver function studies: Secondary | ICD-10-CM | POA: Diagnosis not present

## 2019-04-13 DIAGNOSIS — R0789 Other chest pain: Secondary | ICD-10-CM | POA: Diagnosis not present

## 2019-04-13 MED ORDER — AMBULATORY NON FORMULARY MEDICATION: 0 refills | Status: DC

## 2019-04-13 MED ORDER — PANTOPRAZOLE SODIUM 40 MG PO TBEC: 40.0000 mg | DELAYED_RELEASE_TABLET | Freq: Two times a day (BID) | ORAL | 3 refills | Status: DC

## 2019-04-13 NOTE — Patient Instructions (Signed)
If you are age 19 or older, your body mass index should be between 23-30. Your Body mass index is 46.18 kg/m. If this is out of the aforementioned range listed, please consider follow up with your Primary Care Provider.  If you are age 32 or younger, your body mass index should be between 19-25. Your Body mass index is 46.18 kg/m. If this is out of the aformentioned range listed, please consider follow up with your Primary Care Provider.   We have sent the following medications to your pharmacy for you to pick up at your convenience: GI Cocktail Protonix   Try to continue to reduce weight.   Follow up in 4 weeks.   Thank you,  Dr. Jackquline Denmark

## 2019-04-13 NOTE — Progress Notes (Signed)
Chief Complaint: Abdominal pain  Referring Provider:  [facility] [place];   #1. Atypical chest pains. Neg CxR, MI ruled out, CT abdo/pelvis 04/12/2019.  #2. RUQ abdominal pain. [place] 01/2019 showing GB sludge, fatty liver. CT 02/06/2019- neg except fatty liver. Nl lipase. Neg EGD with SB bx 01/2019.  #3. Abn LFTs likely d/t fatty liver. Has assoc DM2, obesity. No ETOH. Neg acute viral hepatitis, AMA, ASMA, iron studies,S. ceruloplasmin, A1AT, celiac screen and GGT. Immune to A, getting vaccine for B.  Plan: -Increased Protonix 40 mg p.o. BID, 1/2 hr before breakfast and dinner (#60, 11 refills). -GI cocktail 5-10 cc po q6hrs prn (#126m).  Equal amounts of Maalox, liquid Donnatal or Bentyl, viscous lidocaine. - Add LFTs, amylase and lipase to blood drawn yesterday. -If still with problems will give him a trial of Carafate and sublingual hyoscyamine/ consider HIDA. -Follow-up tele-visit in 4 weeks.  He will [place] he is doing in 2 weeks. -Encouraged him to continue reduce weight as the only treatment for fatty liver.  He has lost 15-20 lb over last 6 months.  Encouraged him to have more grilled foods than fried foods.  Also encouraged him to start exercising. -If still with abnormal LFTs, would consider hepatic elastography. -Best possible control of diabetes.  Check blood sugar (finger stick) if he has any abdominal pain in the future. Also, should follow-up with family physician for diabetes. (BS was 344 in ED)    HPI:    Jonathan Budhuis a 18y.o. male  Seen in ED last night With atypical chest pains-MI was ruled out, chest x-ray was unremarkable, CT Abdo/pelvis was unremarkable except for fatty liver.  Report is still in process. Labs revealed elevated glucose at 344.  He was given GI cocktail with almost 70 to 75% relief. Told to restart Protonix.  For follow-up visit (tele) Does feel somewhat better.   No  significant abdominal pain.  Would have heartburn if he misses a dose of Protonix.  Has been working very hard.  Does not have much time to walk except for at work.  He does walk a lot at work.  No diarrhea or constipation.  No history of alcohol abuse.    Past Medical History:  Diagnosis Date  . ADHD (attention deficit hyperactivity disorder)   . [place]   . Fatty liver   . [place]     Past Surgical History:  Procedure Laterality Date  . TONSILLECTOMY      Family History  Problem Relation Age of Onset  . Colon cancer Neg Hx     Social History   Tobacco Use  . Smoking status: Never Smoker  . Smokeless tobacco: Never Used  Substance Use Topics  . Alcohol use: No  . Drug use: Never    Current Outpatient Medications  Medication Sig Dispense Refill  . FLUoxetine (PROZAC) 10 MG tablet Take 1 tablet by mouth daily.    . metFORMIN (GLUCOPHAGE) 1000 MG tablet Take 1,000 mg by mouth 2 (two) times daily with a meal.    . omeprazole (PRILOSEC) 20 MG capsule Take 1 capsule (20 mg total) by mouth daily. 30 capsule 0  . ondansetron (ZOFRAN) 4 MG tablet Take 1 tablet (4 mg total) by mouth every 6 (six) hours. 12 tablet 0  . pantoprazole (PROTONIX) 40 MG tablet Take 1 tablet (40 mg total) by  mouth daily. 1/2 hour before breakfast. 30 tablet 6  . sucralfate (CARAFATE) 1 g tablet Take 1 tablet (1 g total) by mouth 4 (four) times daily -  with meals and at bedtime. 90 tablet 0  . traZODone (DESYREL) 50 MG tablet Take 1 tablet by mouth daily.     No current facility-administered medications for this visit.     No Known Allergies  Review of Systems:  neg     Physical Exam:    Ht 6' 1"  (1.854 m)   Wt (!) 350 lb (158.8 kg)   BMI 46.18 kg/m  Filed Weights   04/13/19 0925  Weight: (!) 350 lb (158.8 kg)  Not examined.  Data Reviewed: I have personally reviewed following labs and imaging studies  CBC: CBC Latest Ref Rng  & Units 02/08/2019 12/15/2016 10/25/2013  WBC 4.0 - 10.5 K/uL 8.8 7.0 8.8  Hemoglobin 13.0 - 17.0 g/dL 14.2 14.2 13.9  Hematocrit 39.0 - 52.0 % 44.0 43.0 40.0  Platelets 150 - 400 K/uL 225 163 236    CMP: CMP Latest Ref Rng & Units 02/08/2019 12/15/2016 10/25/2013  Glucose 70 - 99 mg/dL 282(H) 160(H) 126(H)  BUN 6 - 20 mg/dL 12 11 13   Creatinine 0.61 - 1.24 mg/dL 0.65 0.87 0.64  Sodium 135 - 145 mmol/L 133(L) 137 140  Potassium 3.5 - 5.1 mmol/L 4.1 3.9 4.2  Chloride 98 - 111 mmol/L 102 105 101  CO2 22 - 32 mmol/L 21(L) 24 23  Calcium 8.9 - 10.3 mg/dL 9.1 8.9 9.5  Total Protein 6.5 - 8.1 g/dL 6.9 - 7.4  Total Bilirubin 0.3 - 1.2 mg/dL 0.3 - <0.2(L)  Alkaline Phos 38 - 126 U/L 73 - 233  AST 15 - 41 U/L 60(H) - 60(H)  ALT 0 - 44 U/L 138(H) - 168(H)     Radiology Studies: No results found. This service was provided via telemedicine.  The patient was located at work.  The provider was located in office.  The patient did consent to this tele-visit and is aware of possible charges through their insurance for this visit.   Time spent on call and coordination of care/review of records: 15 min    Carmell Austria, MD 04/13/2019, 11:37 AM  Cc: [facility]*

## 2019-04-20 ENCOUNTER — Telehealth: Payer: Self-pay | Admitting: Gastroenterology

## 2019-04-20 MED ORDER — AMBULATORY NON FORMULARY MEDICATION: 0 refills | Status: AC

## 2019-04-20 NOTE — Telephone Encounter (Signed)
Patient mother needs the Gi Cocktail 5-10 cc every 6 hours as needed. Equal parts of Maalox, Liquid Donnatol or Bentyl, Viscous Lidocaine. Sent to [facility] [address] [phone]. the other drug store do not have it.

## 2019-04-20 NOTE — Telephone Encounter (Signed)
I have sent to [facility].

## 2019-05-16 ENCOUNTER — Telehealth: Payer: Self-pay

## 2019-05-16 NOTE — Telephone Encounter (Signed)
LVM for patient to call back to be asked some questions

## 2019-05-17 ENCOUNTER — Encounter

## 2019-05-17 ENCOUNTER — Ambulatory Visit: Payer: 59 | Admitting: Gastroenterology

## 2019-08-17 ENCOUNTER — Telehealth: Payer: Self-pay | Admitting: Gastroenterology

## 2019-08-17 DIAGNOSIS — R197 Diarrhea, unspecified: Secondary | ICD-10-CM

## 2019-08-17 DIAGNOSIS — D509 Iron deficiency anemia, unspecified: Secondary | ICD-10-CM

## 2019-08-17 DIAGNOSIS — K50919 Crohn's disease, unspecified, with unspecified complications: Secondary | ICD-10-CM

## 2019-08-17 NOTE — Telephone Encounter (Signed)
Called and spoke with patient's mother as the patient-verified DPR-Shannon-she reports the patient is having left sided-mid chest pain radiating under left breast through to his back-making patient want to go to the ER- -taking Protonix BID -Prilosec-not taking -Using Carafate PRN- not helping -Nausea with vomiting (really bad last night-pt really not able to sleep) -Denies diarrhea/constipation/fever -never got the GI cocktail filled because there is not a pharmacy that will compound it Please advise

## 2019-08-17 NOTE — Telephone Encounter (Signed)
Lets get -CBC, CMP, lipase -UA and urine tox screen -I am sure [facility] can fill up GI cocktail. Bre, if you can find out.  If they do, please call it in as per previous note. -For nausea/vomiting lets try Compazine 10 mg p.o. twice daily as needed, 20, no refills -Let us know how he does in 1 to 2 weeks.  RG

## 2019-08-18 MED ORDER — PROCHLORPERAZINE MALEATE 10 MG PO TABS: 10.0000 mg | ORAL_TABLET | Freq: Two times a day (BID) | ORAL | 0 refills | Status: AC | PRN

## 2019-08-18 NOTE — Telephone Encounter (Signed)
Orders placed in Epic

## 2019-08-18 NOTE — Telephone Encounter (Signed)
Left message for patient to call back to the office;  

## 2019-08-22 NOTE — Telephone Encounter (Signed)
Left message for patient to call back to the office;  

## 2019-08-28 ENCOUNTER — Emergency Department (HOSPITAL_BASED_OUTPATIENT_CLINIC_OR_DEPARTMENT_OTHER)
Admission: EM | Admit: 2019-08-28 | Discharge: 2019-08-29 | Disposition: A | Discharge status: Home / Self Care | Payer: 59 | Attending: Emergency Medicine | Admitting: Emergency Medicine

## 2019-08-28 ENCOUNTER — Other Ambulatory Visit: Payer: Self-pay

## 2019-08-28 ENCOUNTER — Encounter (HOSPITAL_BASED_OUTPATIENT_CLINIC_OR_DEPARTMENT_OTHER): Payer: Self-pay | Admitting: Emergency Medicine

## 2019-08-28 DIAGNOSIS — E119 Type 2 diabetes mellitus without complications: Secondary | ICD-10-CM | POA: Diagnosis not present

## 2019-08-28 DIAGNOSIS — R1012 Left upper quadrant pain: Secondary | ICD-10-CM | POA: Diagnosis present

## 2019-08-28 DIAGNOSIS — R1013 Epigastric pain: Secondary | ICD-10-CM | POA: Diagnosis not present

## 2019-08-28 DIAGNOSIS — R110 Nausea: Secondary | ICD-10-CM | POA: Insufficient documentation

## 2019-08-28 NOTE — ED Triage Notes (Signed)
Patient states that he is having left upper quadrant pain - reports that he has had this intermittently since April. Patient reports that he is nauseated as well

## 2019-08-29 ENCOUNTER — Inpatient Hospital Stay (HOSPITAL_BASED_OUTPATIENT_CLINIC_OR_DEPARTMENT_OTHER): Admit: 2019-08-29 | Payer: 59

## 2019-08-29 DIAGNOSIS — R1013 Epigastric pain: Secondary | ICD-10-CM | POA: Diagnosis not present

## 2019-08-29 LAB — URINALYSIS, ROUTINE W REFLEX MICROSCOPIC
Bilirubin Urine: NEGATIVE
Glucose, UA: >=500 mg/dL — AB
Hgb urine dipstick: NEGATIVE
Ketones, ur: NEGATIVE mg/dL
Leukocytes,Ua: NEGATIVE
Nitrite: NEGATIVE
Protein, ur: NEGATIVE mg/dL
Specific Gravity, Urine: 1.025 (ref 1.005–1.030)
pH: 6 (ref 5.0–8.0)

## 2019-08-29 LAB — CBC WITH DIFFERENTIAL/PLATELET
Abs Immature Granulocytes: 0.04 10*3/uL (ref 0.00–0.07)
Basophils Absolute: 0 10*3/uL (ref 0.0–0.1)
Basophils Relative: 0 %
Eosinophils Absolute: 0.1 10*3/uL (ref 0.0–0.5)
Eosinophils Relative: 1 %
HCT: 41.6 % (ref 39.0–52.0)
Hemoglobin: 13.2 g/dL (ref 13.0–17.0)
Immature Granulocytes: 0 %
Lymphocytes Relative: 25 %
Lymphs Abs: 2.8 10*3/uL (ref 0.7–4.0)
MCH: 26.9 pg (ref 26.0–34.0)
MCHC: 31.7 g/dL (ref 30.0–36.0)
MCV: 84.7 fL (ref 80.0–100.0)
Monocytes Absolute: 0.7 10*3/uL (ref 0.1–1.0)
Monocytes Relative: 6 %
Neutro Abs: 7.7 10*3/uL (ref 1.7–7.7)
Neutrophils Relative %: 68 %
Platelets: 255 10*3/uL (ref 150–400)
RBC: 4.91 MIL/uL (ref 4.22–5.81)
RDW: 12.5 % (ref 11.5–15.5)
WBC: 11.4 10*3/uL — ABNORMAL HIGH (ref 4.0–10.5)
nRBC: 0 % (ref 0.0–0.2)

## 2019-08-29 LAB — COMPREHENSIVE METABOLIC PANEL
ALT: 47 U/L — ABNORMAL HIGH (ref 0–44)
AST: 21 U/L (ref 15–41)
Albumin: 3.8 g/dL (ref 3.5–5.0)
Alkaline Phosphatase: 64 U/L (ref 38–126)
Anion gap: 10 (ref 5–15)
BUN: 20 mg/dL (ref 6–20)
CO2: 23 mmol/L (ref 22–32)
Calcium: 8.9 mg/dL (ref 8.9–10.3)
Chloride: 101 mmol/L (ref 98–111)
Creatinine, Ser: 0.77 mg/dL (ref 0.61–1.24)
GFR calc Af Amer: >60 mL/min (ref >60)
GFR calc non Af Amer: >60 mL/min (ref >60)
Glucose, Bld: 253 mg/dL — ABNORMAL HIGH (ref 70–99)
Potassium: 3.8 mmol/L (ref 3.5–5.1)
Sodium: 134 mmol/L — ABNORMAL LOW (ref 135–145)
Total Bilirubin: 0.5 mg/dL (ref 0.3–1.2)
Total Protein: 7.2 g/dL (ref 6.5–8.1)

## 2019-08-29 LAB — URINALYSIS, MICROSCOPIC (REFLEX)

## 2019-08-29 LAB — LIPASE, BLOOD: Lipase: 25 U/L (ref 11–51)

## 2019-08-29 MED ORDER — HYDROCODONE-ACETAMINOPHEN 5-325 MG PO TABS
1.0000 | ORAL_TABLET | Freq: Once | ORAL | Status: AC
  Administered 2019-08-29: 1 via ORAL
  Filled 2019-08-29: qty 1

## 2019-08-29 NOTE — ED Provider Notes (Signed)
[facility] EMERGENCY DEPARTMENT Provider Note   CSN: [id number] Arrival date & time: 08/28/19  2220     History   Chief Complaint Chief Complaint  Patient presents with  . Abdominal Pain    HPI Jonathan Morris is a 18 y.o. male.     HPI  This is a 18 year old male with a history of obesity and diabetes who presents with abdominal pain.  He reports mostly left-sided abdominal pain that has been ongoing on and off since April or May.  He has had multiple evaluations and "I have gallbladder sludge."  He states recently the pain has become more constant and worsened.  It is worsened after eating.  It is mostly in the left upper quadrant and radiates into the epigastrium.  He reports nausea without vomiting or diarrhea.  Denies fevers or urinary symptoms.  Currently rates his pain at 7 out of 10.  I reviewed his chart.  He has been seen here and at [facility] since May of this year.  He has had an ultrasound of the abdomen that showed gallbladder sludge and a CT of the abdomen that was largely reassuring.  He also reports having had an endoscopy that "just showed sludge."   Past Medical History:  Diagnosis Date  . Diabetes mellitus without complication ([place])     There are no active problems to display for this patient.   History reviewed. No pertinent surgical history.      Home Medications    Prior to Admission medications   Not on File    Family History History reviewed. No pertinent family history.  Social History Social History   Tobacco Use  . Smoking status: Never Smoker  . Smokeless tobacco: Never Used  Substance Use Topics  . Alcohol use: Never    Frequency: Never  . Drug use: Never     Allergies   Patient has no known allergies.   Review of Systems Review of Systems  Respiratory: Negative for shortness of breath.   Cardiovascular: Negative for chest pain.  Gastrointestinal: Positive for abdominal pain and nausea.  Negative for constipation, diarrhea and vomiting.  Genitourinary: Negative for dysuria and hematuria.  All other systems reviewed and are negative.    Physical Exam Updated Vital Signs BP 138/86 (BP Location: Right Arm)   Pulse (!) 103   Temp 98.5 F (36.9 C) (Oral)   Resp 20   Ht 1.829 m (6')   Wt (!) 163.3 kg   SpO2 98%   BMI 48.82 kg/m   Physical Exam Vitals signs and nursing note reviewed.  Constitutional:      Appearance: He is well-developed.     Comments: Obese, no acute distress  HENT:     Head: Normocephalic and atraumatic.  Eyes:     Pupils: Pupils are equal, round, and reactive to light.  Neck:     Musculoskeletal: Neck supple.  Cardiovascular:     Rate and Rhythm: Normal rate and regular rhythm.     Heart sounds: Normal heart sounds. No murmur.  Pulmonary:     Effort: Pulmonary effort is normal. No respiratory distress.     Breath sounds: Normal breath sounds. No wheezing.  Abdominal:     General: Bowel sounds are normal.     Palpations: Abdomen is soft.     Tenderness: There is no abdominal tenderness. There is no rebound.     Comments: No objective tenderness to palpation, no rebound or guarding, negative Murphy sign  Lymphadenopathy:     Cervical: No cervical adenopathy.  Skin:    General: Skin is warm and dry.  Neurological:     Mental Status: He is alert and oriented to person, place, and time.  Psychiatric:        Mood and Affect: Mood normal.      ED Treatments / Results  Labs (all labs ordered are listed, but only abnormal results are displayed) Labs Reviewed  URINALYSIS, ROUTINE W REFLEX MICROSCOPIC - Abnormal; Notable for the following components:      Result Value   Glucose, UA >=500 (*)    All other components within normal limits  URINALYSIS, MICROSCOPIC (REFLEX) - Abnormal; Notable for the following components:   Bacteria, UA RARE (*)    All other components within normal limits  CBC WITH DIFFERENTIAL/PLATELET - Abnormal;  Notable for the following components:   WBC 11.4 (*)    All other components within normal limits  COMPREHENSIVE METABOLIC PANEL - Abnormal; Notable for the following components:   Sodium 134 (*)    Glucose, Bld 253 (*)    ALT 47 (*)    All other components within normal limits  LIPASE, BLOOD    EKG None  Radiology No results found.  Procedures Procedures (including critical care time)  Medications Ordered in ED Medications  HYDROcodone-acetaminophen (NORCO/VICODIN) 5-325 MG per tablet 1 tablet (1 tablet Oral Given 08/29/19 0042)     Initial Impression / Assessment and Plan / ED Course  I have reviewed the triage vital signs and the nursing notes.  Pertinent labs & imaging results that were available during my care of the patient were reviewed by me and considered in my medical decision making (see chart for details).        Patient presents with upper abdominal pain.  Reports left upper abdominal pain that radiates in the epigastrium.  He is overall nontoxic-appearing and vital signs are largely reassuring.  He is morbidly obese.  Exam is somewhat limited but no objective tenderness on exam.  He has had a prior ultrasound that showed gallbladder sludge as well as a CT scan that was largely reassuring.  Given the postprandial symptoms, gallbladder pathology is a consideration although his pain is atypical.  Reflux would also be a consideration although he reports having a normal endoscopy.  Labs obtained.  Mild leukocytosis 11.4.  Hyperglycemia without an anion gap.  Otherwise his LFTs and lipase are reassuring.  I discussed with the patient his work-up.  Will order repeat right upper quadrant ultrasound.  I also provided him with general surgery follow-up for him to discuss his gallbladder.  In the absence of acute gallbladder inflammation, I discussed with the patient that I suspect general surgery would be hesitant to move forward with cholecystectomy electively as he is  morbidly obese and would be high risk.  I encouraged diet modification.  Patient stated understanding  After history, exam, and medical workup I feel the patient has been appropriately medically screened and is safe for discharge home. Pertinent diagnoses were discussed with the patient. Patient was given return precautions.   Final Clinical Impressions(s) / ED Diagnoses   Final diagnoses:  Epigastric pain    ED Discharge Orders         Ordered    US Abdomen Limited RUQ/Gall Bladder     08/29/19 0142           Merryl Hacker, MD 08/29/19 [phone]

## 2019-08-29 NOTE — Telephone Encounter (Signed)
Left message for patient to call back to the office if symptoms still persist -office has been unable to contact patient concerning his complaint-will await patient's call back for symptoms not resolved

## 2019-08-29 NOTE — Discharge Instructions (Addendum)
You were seen today for abdominal pain.  Again your work-up is largely reassuring.  Repeat ultrasound of your gallbladder will be ordered.  General surgery follow-up provided as needed.  You can sometimes improve symptoms by avoiding fatty/greasy meals.

## 2019-08-29 NOTE — ED Notes (Signed)
ED Provider at bedside. 

## 2019-08-29 NOTE — ED Notes (Signed)
Escorted to treatment room. Prompted to provide urine sample.

## 2020-02-16 ENCOUNTER — Other Ambulatory Visit: Payer: Self-pay | Admitting: Gastroenterology

## 2020-02-16 ENCOUNTER — Other Ambulatory Visit: Payer: Self-pay

## 2020-02-16 MED ORDER — PANTOPRAZOLE SODIUM 40 MG PO TBEC: 40.0000 mg | DELAYED_RELEASE_TABLET | Freq: Two times a day (BID) | ORAL | 3 refills | Status: AC

## 2020-05-17 ENCOUNTER — Other Ambulatory Visit: Payer: Self-pay

## 2020-05-17 ENCOUNTER — Encounter (HOSPITAL_BASED_OUTPATIENT_CLINIC_OR_DEPARTMENT_OTHER): Payer: Self-pay | Admitting: *Deleted

## 2020-05-17 ENCOUNTER — Emergency Department (HOSPITAL_BASED_OUTPATIENT_CLINIC_OR_DEPARTMENT_OTHER)
Admission: EM | Admit: 2020-05-17 | Discharge: 2020-05-18 | Disposition: A | Discharge status: Home / Self Care | Payer: Medicaid Other | Attending: Emergency Medicine | Admitting: Emergency Medicine

## 2020-05-17 ENCOUNTER — Emergency Department (HOSPITAL_BASED_OUTPATIENT_CLINIC_OR_DEPARTMENT_OTHER): Payer: Medicaid Other

## 2020-05-17 DIAGNOSIS — Z79899 Other long term (current) drug therapy: Secondary | ICD-10-CM | POA: Insufficient documentation

## 2020-05-17 DIAGNOSIS — R1110 Vomiting, unspecified: Secondary | ICD-10-CM | POA: Insufficient documentation

## 2020-05-17 DIAGNOSIS — Y999 Unspecified external cause status: Secondary | ICD-10-CM | POA: Insufficient documentation

## 2020-05-17 DIAGNOSIS — F909 Attention-deficit hyperactivity disorder, unspecified type: Secondary | ICD-10-CM | POA: Insufficient documentation

## 2020-05-17 DIAGNOSIS — S0990XA Unspecified injury of head, initial encounter: Secondary | ICD-10-CM | POA: Diagnosis present

## 2020-05-17 DIAGNOSIS — Y929 Unspecified place or not applicable: Secondary | ICD-10-CM | POA: Insufficient documentation

## 2020-05-17 DIAGNOSIS — S060X0A Concussion without loss of consciousness, initial encounter: Secondary | ICD-10-CM | POA: Insufficient documentation

## 2020-05-17 DIAGNOSIS — Z7984 Long term (current) use of oral hypoglycemic drugs: Secondary | ICD-10-CM | POA: Insufficient documentation

## 2020-05-17 DIAGNOSIS — M542 Cervicalgia: Secondary | ICD-10-CM | POA: Insufficient documentation

## 2020-05-17 DIAGNOSIS — Y939 Activity, unspecified: Secondary | ICD-10-CM | POA: Diagnosis not present

## 2020-05-17 DIAGNOSIS — E119 Type 2 diabetes mellitus without complications: Secondary | ICD-10-CM | POA: Insufficient documentation
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
## 2020-05-17 NOTE — ED Notes (Addendum)
Per EDP order, pt given water for PO challenge per pt request. Pt verbalized understanding to utilize call bell if nausea or emesis occur.
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
## 2021-05-27 ENCOUNTER — Ambulatory Visit: Payer: Medicaid Other | Admitting: Gastroenterology

## 2022-01-05 ENCOUNTER — Encounter (HOSPITAL_BASED_OUTPATIENT_CLINIC_OR_DEPARTMENT_OTHER): Payer: Self-pay | Admitting: Emergency Medicine

## 2022-01-05 ENCOUNTER — Other Ambulatory Visit: Payer: Self-pay

## 2022-01-05 ENCOUNTER — Emergency Department (HOSPITAL_BASED_OUTPATIENT_CLINIC_OR_DEPARTMENT_OTHER)
Admission: EM | Admit: 2022-01-05 | Discharge: 2022-01-05 | Disposition: A | Discharge status: Home / Self Care | Payer: BLUE CROSS/BLUE SHIELD | Attending: Emergency Medicine | Admitting: Emergency Medicine

## 2022-01-05 ENCOUNTER — Emergency Department (HOSPITAL_BASED_OUTPATIENT_CLINIC_OR_DEPARTMENT_OTHER): Payer: BLUE CROSS/BLUE SHIELD

## 2022-01-05 DIAGNOSIS — L03314 Cellulitis of groin: Secondary | ICD-10-CM | POA: Diagnosis not present

## 2022-01-05 DIAGNOSIS — R1909 Other intra-abdominal and pelvic swelling, mass and lump: Secondary | ICD-10-CM | POA: Diagnosis present

## 2022-01-05 DIAGNOSIS — E119 Type 2 diabetes mellitus without complications: Secondary | ICD-10-CM | POA: Insufficient documentation

## 2022-01-05 DIAGNOSIS — Z7984 Long term (current) use of oral hypoglycemic drugs: Secondary | ICD-10-CM | POA: Diagnosis not present

## 2022-01-05 LAB — CBC WITH DIFFERENTIAL/PLATELET
Abs Immature Granulocytes: 0.04 10*3/uL (ref 0.00–0.07)
Basophils Absolute: 0 10*3/uL (ref 0.0–0.1)
Basophils Relative: 0 %
Eosinophils Absolute: 0 10*3/uL (ref 0.0–0.5)
Eosinophils Relative: 0 %
HCT: 42.9 % (ref 39.0–52.0)
Hemoglobin: 14.4 g/dL (ref 13.0–17.0)
Immature Granulocytes: 0 %
Lymphocytes Relative: 21 %
Lymphs Abs: 2.5 10*3/uL (ref 0.7–4.0)
MCH: 26.8 pg (ref 26.0–34.0)
MCHC: 33.6 g/dL (ref 30.0–36.0)
MCV: 79.7 fL — ABNORMAL LOW (ref 80.0–100.0)
Monocytes Absolute: 0.6 10*3/uL (ref 0.1–1.0)
Monocytes Relative: 5 %
Neutro Abs: 8.6 10*3/uL — ABNORMAL HIGH (ref 1.7–7.7)
Neutrophils Relative %: 74 %
Platelets: 219 10*3/uL (ref 150–400)
RBC: 5.38 MIL/uL (ref 4.22–5.81)
RDW: 12.8 % (ref 11.5–15.5)
WBC: 11.9 10*3/uL — ABNORMAL HIGH (ref 4.0–10.5)
nRBC: 0 % (ref 0.0–0.2)

## 2022-01-05 LAB — BASIC METABOLIC PANEL
Anion gap: 10 (ref 5–15)
BUN: 10 mg/dL (ref 6–20)
CO2: 21 mmol/L — ABNORMAL LOW (ref 22–32)
Calcium: 8.8 mg/dL — ABNORMAL LOW (ref 8.9–10.3)
Chloride: 102 mmol/L (ref 98–111)
Creatinine, Ser: 0.67 mg/dL (ref 0.61–1.24)
GFR, Estimated: >60 mL/min (ref >60)
Glucose, Bld: 234 mg/dL — ABNORMAL HIGH (ref 70–99)
Potassium: 3.8 mmol/L (ref 3.5–5.1)
Sodium: 133 mmol/L — ABNORMAL LOW (ref 135–145)

## 2022-01-05 LAB — CBG MONITORING, ED
Glucose-Capillary: 224 mg/dL — ABNORMAL HIGH (ref 70–99)
Glucose-Capillary: 215 mg/dL — ABNORMAL HIGH (ref 70–99)

## 2022-01-05 MED ORDER — IOHEXOL 300 MG/ML  SOLN
100.0000 mL | Freq: Once | INTRAMUSCULAR | Status: AC | PRN
  Administered 2022-01-05: 100 mL via INTRAVENOUS

## 2022-01-05 MED ORDER — SODIUM CHLORIDE 0.9 % IV BOLUS
1000.0000 mL | Freq: Once | INTRAVENOUS | Status: AC
  Administered 2022-01-05: 1000 mL via INTRAVENOUS

## 2022-01-05 MED ORDER — CLINDAMYCIN HCL 150 MG PO CAPS: 450.0000 mg | ORAL_CAPSULE | Freq: Three times a day (TID) | ORAL | 0 refills | Status: AC

## 2022-01-05 MED ORDER — IOHEXOL 300 MG/ML  SOLN: 100.0000 mL | Freq: Once | INTRAMUSCULAR | Status: DC | PRN

## 2022-01-05 MED ORDER — CLINDAMYCIN HCL 150 MG PO CAPS
450.0000 mg | ORAL_CAPSULE | Freq: Once | ORAL | Status: AC
Start: 2022-01-05 — End: 2022-01-05
  Administered 2022-01-05: 450 mg via ORAL
  Filled 2022-01-05: qty 3

## 2022-01-05 MED ORDER — MORPHINE SULFATE (PF) 4 MG/ML IV SOLN
4.0000 mg | Freq: Once | INTRAVENOUS | Status: AC
  Administered 2022-01-05: 4 mg via INTRAVENOUS
  Filled 2022-01-05: qty 1

## 2022-01-05 NOTE — ED Triage Notes (Signed)
Abscess to groin area ?

## 2022-01-05 NOTE — ED Provider Notes (Signed)
?[facility] ?Provider Note ? ? ?CSN: [id number] ?Arrival date & time: 01/05/22  1901 ? ?  ? ?History ? ?Chief Complaint  ?Patient presents with  ? Abscess  ? ? ?Jonathan Morris is a 21 y.o. male. ? ? ?Abscess ? ?Patient is a 21 year old male with past medical history significant for DM2 on Amaryl, insulin, metformin and Januvia.  States he has been out of his metformin for some time ? ?Patient is presented emergency room today with complaint of swelling and redness to the left groin area for the past couple days. ? ?No nausea vomiting or fever.  Denies polyuria polydipsia. ? ?No chest pain or difficulty breathing. ? ?Denies any spreading of redness.  States that he occasionally has boils but is never had to have incision and drainage done. ? ?  ? ?Home Medications ?Prior to Admission medications   ?Medication Sig Start Date End Date Taking? Authorizing Provider  ?clindamycin (CLEOCIN) 150 MG capsule Take 3 capsules (450 mg total) by mouth 3 (three) times daily for 7 days. 01/05/22 01/12/22 Yes Tedd Sias, PA  ?AMBULATORY NON FORMULARY MEDICATION Medication Name: Gi Cocktail 5-10 cc every 6 hours as needed. Equal parts of Maalox, Liquid Donnatol or Bentyl, Viscous Lidocaine. 04/20/19   Jackquline Denmark, MD  ?FLUoxetine (PROZAC) 10 MG tablet Take 1 tablet by mouth daily.    [provider]  ?glimepiride (AMARYL) 4 MG tablet Take 4 mg by mouth 2 (two) times daily. 11/25/19   [provider]  ?metFORMIN (GLUCOPHAGE) 1000 MG tablet Take 1,000 mg by mouth 2 (two) times daily with a meal.    [provider]  ?omeprazole (PRILOSEC) 20 MG capsule Take 1 capsule (20 mg total) by mouth daily. 02/08/19   Blanchie Dessert, MD  ?ondansetron (ZOFRAN) 4 MG tablet Take 1 tablet (4 mg total) by mouth every 6 (six) hours. 12/15/16   Frederica Kuster, PA-C  ?pantoprazole (PROTONIX) 40 MG tablet Take 1 tablet (40 mg total) by mouth daily. 1/2 hour before breakfast. 02/10/19    Jackquline Denmark, MD  ?pantoprazole (PROTONIX) 40 MG tablet Take 1 tablet (40 mg total) by mouth 2 (two) times daily. 02/16/20   Jackquline Denmark, MD  ?prochlorperazine (COMPAZINE) 10 MG tablet Take 1 tablet (10 mg total) by mouth 2 (two) times daily as needed for nausea or vomiting. 08/18/19   Jackquline Denmark, MD  ?SITagliptin Phosphate (JANUVIA PO) Take by mouth.    [provider]  ?sucralfate (CARAFATE) 1 g tablet Take 1 tablet (1 g total) by mouth 4 (four) times daily -  with meals and at bedtime. 02/08/19   Blanchie Dessert, MD  ?traZODone (DESYREL) 50 MG tablet Take 1 tablet by mouth daily.    [provider]  ?   ? ?Allergies    ?Patient has no known allergies.   ? ?Review of Systems   ?Review of Systems ? ?Physical Exam ?Updated Vital Signs ?BP 127/81   Pulse 96   Temp 98.8 ?F (37.1 ?C) (Oral)   Resp 20   Ht 5' 11"  (1.803 m)   Wt (!) 172.4 kg   SpO2 97%   BMI 53.00 kg/m?  ?Physical Exam ?Vitals and nursing note reviewed.  ?Constitutional:   ?   General: He is not in acute distress. ?HENT:  ?   Head: Normocephalic and atraumatic.  ?   Nose: Nose normal.  ?   Mouth/Throat:  ?   Mouth: Mucous membranes are dry.  ?Eyes:  ?  General: No scleral icterus. ?Cardiovascular:  ?   Rate and Rhythm: Normal rate and regular rhythm.  ?   Pulses: Normal pulses.  ?   Heart sounds: Normal heart sounds.  ?Pulmonary:  ?   Effort: Pulmonary effort is normal. No respiratory distress.  ?   Breath sounds: No wheezing.  ?Abdominal:  ?   Palpations: Abdomen is soft.  ?   Tenderness: There is no abdominal tenderness.  ?Musculoskeletal:  ?   Cervical back: Normal range of motion.  ?   Right lower leg: No edema.  ?   Left lower leg: No edema.  ?Skin: ?   General: Skin is warm and dry.  ?   Capillary Refill: Capillary refill takes less than 2 seconds.  ?Neurological:  ?   Mental Status: He is alert. Mental status is at baseline.  ?Psychiatric:     ?   Mood and Affect: Mood normal.     ?   Behavior: Behavior normal.   ? ? ?ED Results / Procedures / Treatments   ?Labs ?(all labs ordered are listed, but only abnormal results are displayed) ?Labs Reviewed  ?BASIC METABOLIC PANEL - Abnormal; Notable for the following components:  ?    Result Value  ? Sodium 133 (*)   ? CO2 21 (*)   ? Glucose, Bld 234 (*)   ? Calcium 8.8 (*)   ? All other components within normal limits  ?CBC WITH DIFFERENTIAL/PLATELET - Abnormal; Notable for the following components:  ? WBC 11.9 (*)   ? MCV 79.7 (*)   ? Neutro Abs 8.6 (*)   ? All other components within normal limits  ?CBG MONITORING, ED - Abnormal; Notable for the following components:  ? Glucose-Capillary 224 (*)   ? All other components within normal limits  ?CBG MONITORING, ED - Abnormal; Notable for the following components:  ? Glucose-Capillary 215 (*)   ? All other components within normal limits  ? ? ?EKG ?None ? ?Radiology ?CT ABDOMEN PELVIS W CONTRAST ? ?Result Date: 01/05/2022 ?CLINICAL DATA:  Groin area abscess. EXAM: CT ABDOMEN AND PELVIS WITH CONTRAST TECHNIQUE: Multidetector CT imaging of the abdomen and pelvis was performed using the standard protocol following bolus administration of intravenous contrast. RADIATION DOSE REDUCTION: This exam was performed according to the departmental dose-optimization program which includes automated exposure control, adjustment of the mA and/or kV according to patient size and/or use of iterative reconstruction technique. CONTRAST:  1102m OMNIPAQUE IOHEXOL 300 MG/ML  SOLN COMPARISON:  Chest, abdomen and pelvis CT 07/16/2019 FINDINGS: Lower chest: No acute abnormality. Asymmetrically elevated right hemidiaphragm again noted. Hepatobiliary: Moderately steatotic enlarged liver, measures 27.4 cm length. No mass enhancement. Unremarkable gallbladder and bile ducts. Pancreas: Unremarkable. Spleen: Enlarged, measuring 17.7 by 16.5 by 5.9 cm, not significantly changed. No mass enhancement. Adrenals/Urinary Tract: There is no adrenal or renal mass  enhancement, no urinary stone or obstruction no bladder thickening. Stomach/Bowel: No dilatation or wall thickening. The appendix is normal. There is mild constipation and diverticulosis. No inflammatory changes or focal colitis. Vascular/Lymphatic: There is increasing prominence of the hepatic portal vein which measures 18 mm. The aorta is normal. There are prominent left inguinal chain nodes up to 1.3 cm in short axis and similar prominent left obturator and left external iliac chain nodes. Stable clustered borderline prominent mesenteric root nodes. Reproductive: Normal prostate. Other: There is asymmetric skin thickening, swelling and fatty induration beginning left of the midline in the mons pubis extending to the left-sided scrotal wall  which is asymmetrically thickened. No focal fluid collection is seen consistent with a drainable abscess. The findings are consistent with cellulitis. There is no visible extension of the process into the perineum, buttocks and upper thighs at this time. There are small inguinal fat hernias. Musculoskeletal: There is chronic mild anterior wedging of the T11 and T10 vertebrae with intervening anterior endplate osteophytes. No acute or further significant osseous findings. IMPRESSION: 1. Skin thickening and induration beginning left of the midline in the mons pubis extending to the left lateral scrotal wall which is asymmetrically thicker. The findings are consistent with cellulitis without a drainable abscess or focal fluid collection. 2. There is reactive left inguinal and left pelvic wall adenopathy. 3. There is no visible extension of the process posteriorly into the perineal spaces, buttocks and upper thighs at this time. There is no appreciable soft tissue gas. 4. Hepatosplenomegaly with moderate hepatic steatosis and prominent portal vein. 5. 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 6. Small inguinal fat hernias. Electronically Signed   By: Telford Nab M.D.   On:  01/05/2022 22:23   ? ?Procedures ?Procedures  ? ? ?Medications Ordered in ED ?Medications  ?sodium chloride 0.9 % bolus 1,000 mL ( Intravenous Stopped 01/05/22 2209)  ?morphine (PF) 4 MG/ML injection 4 mg (4 mg Intravenous Given

## 2022-01-05 NOTE — Discharge Instructions (Addendum)
Please follow-up with your primary care provider. ? ?Take antibiotics for the entire course.  You may alternate Tylenol and ibuprofen as discussed. ? ?As we discussed it is vitally important to take the entire course of antibiotics even if your symptoms improve. ? ?It is also vitally important to control your blood sugar ?

## 2022-02-16 ENCOUNTER — Encounter (HOSPITAL_BASED_OUTPATIENT_CLINIC_OR_DEPARTMENT_OTHER): Payer: Self-pay | Admitting: Emergency Medicine

## 2022-02-16 ENCOUNTER — Other Ambulatory Visit: Payer: Self-pay

## 2022-02-16 ENCOUNTER — Emergency Department (HOSPITAL_BASED_OUTPATIENT_CLINIC_OR_DEPARTMENT_OTHER)
Admission: EM | Admit: 2022-02-16 | Discharge: 2022-02-17 | Disposition: A | Discharge status: Home / Self Care | Payer: BLUE CROSS/BLUE SHIELD | Attending: Emergency Medicine | Admitting: Emergency Medicine

## 2022-02-16 ENCOUNTER — Emergency Department (HOSPITAL_BASED_OUTPATIENT_CLINIC_OR_DEPARTMENT_OTHER): Payer: BLUE CROSS/BLUE SHIELD

## 2022-02-16 DIAGNOSIS — R531 Weakness: Secondary | ICD-10-CM | POA: Diagnosis not present

## 2022-02-16 DIAGNOSIS — Z20822 Contact with and (suspected) exposure to covid-19: Secondary | ICD-10-CM | POA: Diagnosis not present

## 2022-02-16 DIAGNOSIS — R109 Unspecified abdominal pain: Secondary | ICD-10-CM | POA: Insufficient documentation

## 2022-02-16 DIAGNOSIS — E119 Type 2 diabetes mellitus without complications: Secondary | ICD-10-CM | POA: Diagnosis not present

## 2022-02-16 DIAGNOSIS — R519 Headache, unspecified: Secondary | ICD-10-CM | POA: Diagnosis not present

## 2022-02-16 DIAGNOSIS — Z7984 Long term (current) use of oral hypoglycemic drugs: Secondary | ICD-10-CM | POA: Diagnosis not present

## 2022-02-16 DIAGNOSIS — R5383 Other fatigue: Secondary | ICD-10-CM | POA: Insufficient documentation

## 2022-02-16 LAB — I-STAT VENOUS BLOOD GAS, ED
Acid-base deficit: 2 mmol/L (ref 0.0–2.0)
Bicarbonate: 22.4 mmol/L (ref 20.0–28.0)
Calcium, Ion: 1.18 mmol/L (ref 1.15–1.40)
HCT: 43 % (ref 39.0–52.0)
Hemoglobin: 14.6 g/dL (ref 13.0–17.0)
O2 Saturation: 96 %
Patient temperature: 98.1
Potassium: 4 mmol/L (ref 3.5–5.1)
Sodium: 136 mmol/L (ref 135–145)
TCO2: 23 mmol/L (ref 22–32)
pCO2, Ven: 34.6 mmHg — ABNORMAL LOW (ref 44–60)
pH, Ven: 7.417 (ref 7.25–7.43)
pO2, Ven: 82 mmHg — ABNORMAL HIGH (ref 32–45)

## 2022-02-16 LAB — URINALYSIS, MICROSCOPIC (REFLEX)

## 2022-02-16 LAB — URINALYSIS, ROUTINE W REFLEX MICROSCOPIC
Bilirubin Urine: NEGATIVE
Glucose, UA: >=500 mg/dL — AB
Ketones, ur: NEGATIVE mg/dL
Leukocytes,Ua: NEGATIVE
Nitrite: NEGATIVE
Protein, ur: NEGATIVE mg/dL
Specific Gravity, Urine: 1.015 (ref 1.005–1.030)
pH: 6 (ref 5.0–8.0)

## 2022-02-16 LAB — CBC WITH DIFFERENTIAL/PLATELET
Abs Immature Granulocytes: 0.02 10*3/uL (ref 0.00–0.07)
Basophils Absolute: 0 10*3/uL (ref 0.0–0.1)
Basophils Relative: 0 %
Eosinophils Absolute: 0.1 10*3/uL (ref 0.0–0.5)
Eosinophils Relative: 1 %
HCT: 41.5 % (ref 39.0–52.0)
Hemoglobin: 13.9 g/dL (ref 13.0–17.0)
Immature Granulocytes: 0 %
Lymphocytes Relative: 33 %
Lymphs Abs: 2.3 10*3/uL (ref 0.7–4.0)
MCH: 27 pg (ref 26.0–34.0)
MCHC: 33.5 g/dL (ref 30.0–36.0)
MCV: 80.6 fL (ref 80.0–100.0)
Monocytes Absolute: 0.4 10*3/uL (ref 0.1–1.0)
Monocytes Relative: 6 %
Neutro Abs: 4.1 10*3/uL (ref 1.7–7.7)
Neutrophils Relative %: 60 %
Platelets: 199 10*3/uL (ref 150–400)
RBC: 5.15 MIL/uL (ref 4.22–5.81)
RDW: 12.6 % (ref 11.5–15.5)
WBC: 6.9 10*3/uL (ref 4.0–10.5)
nRBC: 0 % (ref 0.0–0.2)

## 2022-02-16 LAB — COMPREHENSIVE METABOLIC PANEL
ALT: 204 U/L — ABNORMAL HIGH (ref 0–44)
AST: 84 U/L — ABNORMAL HIGH (ref 15–41)
Albumin: 3.5 g/dL (ref 3.5–5.0)
Alkaline Phosphatase: 67 U/L (ref 38–126)
Anion gap: 7 (ref 5–15)
BUN: 10 mg/dL (ref 6–20)
CO2: 22 mmol/L (ref 22–32)
Calcium: 8.7 mg/dL — ABNORMAL LOW (ref 8.9–10.3)
Chloride: 105 mmol/L (ref 98–111)
Creatinine, Ser: 0.73 mg/dL (ref 0.61–1.24)
GFR, Estimated: >60 mL/min (ref >60)
Glucose, Bld: 397 mg/dL — ABNORMAL HIGH (ref 70–99)
Potassium: 3.9 mmol/L (ref 3.5–5.1)
Sodium: 134 mmol/L — ABNORMAL LOW (ref 135–145)
Total Bilirubin: 0.4 mg/dL (ref 0.3–1.2)
Total Protein: 6.6 g/dL (ref 6.5–8.1)

## 2022-02-16 LAB — RESP PANEL BY RT-PCR (FLU A&B, COVID) ARPGX2
Influenza A by PCR: NEGATIVE
Influenza B by PCR: NEGATIVE
SARS Coronavirus 2 by RT PCR: NEGATIVE

## 2022-02-16 LAB — CBG MONITORING, ED: Glucose-Capillary: 457 mg/dL — ABNORMAL HIGH (ref 70–99)

## 2022-02-16 LAB — LIPASE, BLOOD: Lipase: 36 U/L (ref 11–51)

## 2022-02-16 MED ORDER — ONDANSETRON HCL 4 MG/2ML IJ SOLN
4.0000 mg | Freq: Once | INTRAMUSCULAR | Status: AC
Start: 2022-02-16 — End: 2022-02-16
  Administered 2022-02-16: 4 mg via INTRAVENOUS
  Filled 2022-02-16: qty 2

## 2022-02-16 MED ORDER — SODIUM CHLORIDE 0.9 % IV BOLUS
1000.0000 mL | Freq: Once | INTRAVENOUS | Status: AC
  Administered 2022-02-16: 1000 mL via INTRAVENOUS

## 2022-02-16 MED ORDER — IOHEXOL 300 MG/ML  SOLN
100.0000 mL | Freq: Once | INTRAMUSCULAR | Status: AC | PRN
  Administered 2022-02-16: 100 mL via INTRAVENOUS

## 2022-02-16 MED ORDER — KETOROLAC TROMETHAMINE 15 MG/ML IJ SOLN
15.0000 mg | Freq: Once | INTRAMUSCULAR | Status: AC
  Administered 2022-02-16: 15 mg via INTRAVENOUS
  Filled 2022-02-16: qty 1

## 2022-02-16 NOTE — Discharge Instructions (Addendum)
Your laboratory results were within normal limits today. Please follow up with your primary care physician for you elevated blood pressure readings on todays visit.   If you experience any worsening symptoms, please return to the Emergency Department.

## 2022-02-16 NOTE — ED Notes (Signed)
Patient transported to CT 

## 2022-02-16 NOTE — ED Provider Notes (Signed)
[facility] Provider Note   CSN: [id number] Arrival date & time: 02/16/22  1921     History  Chief Complaint  Patient presents with   Fatigue    Jonathan Morris is a 21 y.o. male.  21 year old male with a PMH of diabetes presents to the ED with a chief complaint of generalized weakness x this morning. Patient states feeling overall rundown like he has the flu, complaining of overall fatigue and feeling weak however it states that he does not have any flu symptoms.  He does endorse a headache, like his head is about to be split in half.  Does report the pain is along the frontal aspect radiating to the back of his neck.  Took ibuprofen once today around noon.  Does endorse some nausea however has not had any episodes of vomiting.  He states that he did not take any of his insulin today as he had apparently eaten.  He did have eggs, juice for breakfast this morning.  Patient is unaware of sick contacts, does work for an [employer], [employer].  No chest pain, no shortness of breath, no diarrhea.  Dizziness, not lightheaded, no trauma.   The history is provided by the patient and medical records.      Home Medications Prior to Admission medications   Medication Sig Start Date End Date Taking? Authorizing Provider  AMBULATORY NON FORMULARY MEDICATION Medication Name: Gi Cocktail 5-10 cc every 6 hours as needed. Equal parts of Maalox, Liquid Donnatol or Bentyl, Viscous Lidocaine. 04/20/19   Jackquline Denmark, MD  FLUoxetine (PROZAC) 10 MG tablet Take 1 tablet by mouth daily.    [provider]  glimepiride (AMARYL) 4 MG tablet Take 4 mg by mouth 2 (two) times daily. 11/25/19   [provider]  metFORMIN (GLUCOPHAGE) 1000 MG tablet Take 1,000 mg by mouth 2 (two) times daily with a meal.    [provider]  omeprazole (PRILOSEC) 20 MG capsule Take 1 capsule (20 mg total) by mouth daily. 02/08/19   Blanchie Dessert, MD  ondansetron  (ZOFRAN) 4 MG tablet Take 1 tablet (4 mg total) by mouth every 6 (six) hours. 12/15/16   Law, Bea Graff, PA-C  pantoprazole (PROTONIX) 40 MG tablet Take 1 tablet (40 mg total) by mouth daily. 1/2 hour before breakfast. 02/10/19   Jackquline Denmark, MD  pantoprazole (PROTONIX) 40 MG tablet Take 1 tablet (40 mg total) by mouth 2 (two) times daily. 02/16/20   Jackquline Denmark, MD  prochlorperazine (COMPAZINE) 10 MG tablet Take 1 tablet (10 mg total) by mouth 2 (two) times daily as needed for nausea or vomiting. 08/18/19   Jackquline Denmark, MD  SITagliptin Phosphate (JANUVIA PO) Take by mouth.    [provider]  sucralfate (CARAFATE) 1 g tablet Take 1 tablet (1 g total) by mouth 4 (four) times daily -  with meals and at bedtime. 02/08/19   Blanchie Dessert, MD  traZODone (DESYREL) 50 MG tablet Take 1 tablet by mouth daily.    [provider]      Allergies    Patient has no known allergies.    Review of Systems   Review of Systems  Constitutional:  Negative for chills and fever.  HENT:  Negative for sore throat.   Respiratory:  Negative for shortness of breath.   Cardiovascular:  Negative for chest pain.  Gastrointestinal:  Negative for abdominal pain, nausea and vomiting.  Genitourinary:  Negative for flank pain.  Musculoskeletal:  Negative  for back pain.  Skin:  Negative for pallor and wound.  Neurological:  Positive for weakness and headaches. Negative for light-headedness.  All other systems reviewed and are negative.  Physical Exam Updated Vital Signs BP 134/78   Pulse 73   Temp 98.1 F (36.7 C) (Oral)   Resp 12   Ht 5' 11"  (1.803 m)   Wt (!) 158.8 kg   SpO2 98%   BMI 48.82 kg/m  Physical Exam Vitals and nursing note reviewed.  Constitutional:      General: He is not in acute distress.    Appearance: Normal appearance. He is obese. He is not ill-appearing.  HENT:     Head: Normocephalic and atraumatic.     Mouth/Throat:     Mouth: Mucous membranes are dry.   Eyes:     Pupils: Pupils are equal, round, and reactive to light.  Cardiovascular:     Rate and Rhythm: Normal rate.  Pulmonary:     Effort: Pulmonary effort is normal.     Breath sounds: No wheezing or rales.  Abdominal:     General: Abdomen is flat.     Tenderness: There is abdominal tenderness. There is no right CVA tenderness or left CVA tenderness.  Musculoskeletal:     Cervical back: Normal range of motion and neck supple.  Skin:    General: Skin is warm and dry.  Neurological:     Mental Status: He is alert and oriented to person, place, and time.     Comments: Alert, oriented, thought content appropriate. Speech fluent without evidence of aphasia. Able to follow 2 step commands without difficulty.  Cranial Nerves:  II:  Peripheral visual fields grossly normal, pupils, round, reactive to light III,IV, VI: ptosis not present, extra-ocular motions intact bilaterally  V,VII: smile symmetric, facial light touch sensation equal VIII: hearing grossly normal bilaterally  IX,X: midline uvula rise  XI: bilateral shoulder shrug equal and strong XII: midline tongue extension  Motor:  5/5 in upper and lower extremities bilaterally including strong and equal grip strength and dorsiflexion/plantar flexion Sensory: light touch normal in all extremities.  Cerebellar: normal finger-to-nose with bilateral upper extremities, pronator drift negative Gait: normal gait and balance      ED Results / Procedures / Treatments   Labs (all labs ordered are listed, but only abnormal results are displayed) Labs Reviewed  COMPREHENSIVE METABOLIC PANEL - Abnormal; Notable for the following components:      Result Value   Sodium 134 (*)    Glucose, Bld 397 (*)    Calcium 8.7 (*)    AST 84 (*)    ALT 204 (*)    All other components within normal limits  URINALYSIS, ROUTINE W REFLEX MICROSCOPIC - Abnormal; Notable for the following components:   Glucose, UA >=500 (*)    Hgb urine dipstick TRACE  (*)    All other components within normal limits  URINALYSIS, MICROSCOPIC (REFLEX) - Abnormal; Notable for the following components:   Bacteria, UA RARE (*)    All other components within normal limits  CBG MONITORING, ED - Abnormal; Notable for the following components:   Glucose-Capillary 457 (*)    All other components within normal limits  I-STAT VENOUS BLOOD GAS, ED - Abnormal; Notable for the following components:   pCO2, Ven 34.6 (*)    pO2, Ven 82 (*)    All other components within normal limits  RESP PANEL BY RT-PCR (FLU A&B, COVID) ARPGX2  CBC WITH DIFFERENTIAL/PLATELET  LIPASE,  BLOOD  CORD BLOOD GAS (VENOUS)    EKG EKG Interpretation  Date/Time:  Sunday Feb 16 2022 19:33:54 EDT Ventricular Rate:  96 PR Interval:  146 QRS Duration: 84 QT Interval:  338 QTC Calculation: 427 R Axis:   -1 Text Interpretation: Normal sinus rhythm Nonspecific ST abnormality Abnormal ECG When compared with ECG of 25-Oct-2013 21:13, PREVIOUS ECG IS PRESENT No significant change since last tracing Confirmed by Gareth Morgan [phone]) on 02/16/2022 10:35:42 PM  Radiology CT ABDOMEN PELVIS W CONTRAST  Result Date: 02/16/2022 CLINICAL DATA:  Abdominal pain. EXAM: CT ABDOMEN AND PELVIS WITH CONTRAST TECHNIQUE: Multidetector CT imaging of the abdomen and pelvis was performed using the standard protocol following bolus administration of intravenous contrast. RADIATION DOSE REDUCTION: This exam was performed according to the departmental dose-optimization program which includes automated exposure control, adjustment of the mA and/or kV according to patient size and/or use of iterative reconstruction technique. CONTRAST:  161m OMNIPAQUE IOHEXOL 300 MG/ML  SOLN COMPARISON:  CT abdomen pelvis dated 01/05/2022. FINDINGS: Lower chest: The visualized lung bases are clear. No intra-abdominal free air or free fluid. Hepatobiliary: Diffuse fatty liver. No intrahepatic biliary dilatation. The gallbladder is  unremarkable. Pancreas: Unremarkable. No pancreatic ductal dilatation or surrounding inflammatory changes. Spleen: Normal in size without focal abnormality. Adrenals/Urinary Tract: The adrenal glands are unremarkable. The kidneys, visualized ureters, and urinary bladder appear unremarkable. Stomach/Bowel: There is no bowel obstruction or active inflammation. The appendix is normal. Vascular/Lymphatic: The abdominal aorta and IVC are unremarkable. No portal venous gas. There is no adenopathy. Reproductive: The prostate and seminal vesicles are grossly remarkable. No pelvic mass. Other: None Musculoskeletal: No acute or significant osseous findings. IMPRESSION: 1. No acute intra-abdominal or pelvic pathology. 2. Fatty liver. Electronically Signed   By: AAnner CreteM.D.   On: 02/16/2022 22:36    Procedures Procedures    Medications Ordered in ED Medications  ondansetron (ZOFRAN) injection 4 mg (4 mg Intravenous Given 02/16/22 2205)  sodium chloride 0.9 % bolus 1,000 mL (0 mLs Intravenous Stopped 02/16/22 2313)  iohexol (OMNIPAQUE) 300 MG/ML solution 100 mL (100 mLs Intravenous Contrast Given 02/16/22 2215)  ketorolac (TORADOL) 15 MG/ML injection 15 mg (15 mg Intravenous Given 02/16/22 2345)    ED Course/ Medical Decision Making/ A&P Clinical Course as of 02/17/22 0010  Sun Feb 16, 2022  2357 ALT(!): 204 [JS]  2357 AST(!): 84 [JS]  Mon Feb 17, 2022  0002 Bacteria, UA(!): RARE [JS]    Clinical Course User Index [JS] SJaneece Fitting PA-C                           Medical Decision Making Amount and/or Complexity of Data Reviewed Labs: ordered. Radiology: ordered.  Risk Prescription drug management.  This patient presents to the ED for concern of fatigue, this involves a number of treatment options, and is a complaint that carries with it a high risk of complications and morbidity.  The differential diagnosis includes DKA, electrolyte derangement, versus viral influenza.     Co  morbidities: Discussed in HPI   Brief History:  Patient with underlying history of DM here with generalized fatigue along with weakness. No fever, feels like he has the Flu but no upper respiratory symptoms.  He is endorsing a headache, that he took some ibuprofen for earlier today. No vision changes, no weakness to arms or legs. No fever, no abdominal pain or chest pain.  EMR reviewed including pt PMHx, past surgical history  and past visits to ER.   See HPI for more details   Lab Tests:  I ordered and independently interpreted labs.  The pertinent results include:    I personally reviewed all laboratory work and imaging. Metabolic panel without any acute abnormality specifically kidney function within normal limits and no significant electrolyte abnormalities. CBC without leukocytosis or significant anemia. Respiratory panel is negative for covid 19, influenza A and influenza B.  UA without rare bacteria, no WBC, no nitrites or ketones to suggest DKA. No anion gap on labs.   Imaging Studies:  NAD. I personally reviewed all imaging studies and no acute abnormality found. I agree with radiology interpretation.  1. No acute intra-abdominal or pelvic pathology.  2. Fatty liver.      Cardiac Monitoring:  The patient was maintained on a cardiac monitor.  I personally viewed and interpreted the cardiac monitored which showed an underlying rhythm of: NSR, no delta wave present.  EKG non-ischemic   Medicines ordered:  I ordered medication including bolus  for hyperglycemia Reevaluation of the patient after these medicines showed that the patient improved I have reviewed the patients home medicines and have made adjustments as needed. Given Toradol for discomfort, with improvement in symptoms.  Reevaluation:  After the interventions noted above I re-evaluated patient and found that they have :resolved   Social Determinants of Health:  The patient's social determinants of health  were a factor in the care of this patient  Problem List / ED Course:  Patient here with generalized weakness. Exam is benign with no neuro findings, he is ambulatory with steady gate. Labs are within normal limits aside from elevated LFT's considered cholelithiasis with nausea present however unable to obtain ultrasound today due to staffing. CT obtained with no acute findings gallbladder appears normal. No TTP along the RUQ. He denies any vomiting. No prior alcohol use. We discussed elevated LFTs and need for recheck. UA with no infectious signs. CMP without electrolyte abnormalities, creatine level is within normal limits. No signs of DKA, with no gap. Negative respiratory panel. Discussed possible viral etiology, patient;s condition and headache has resolved after toradol. Did consider CT however with reassuring neuro exam, I have a lower suspicion for hemorrhage, mass or acute pathology. Vitals are within normal limits, patient is stable for discharge.     Dispostion:  After consideration of the diagnostic results and the patients response to treatment, I feel that the patent would benefit from outpatient follow up with PCP, strict return precautions provided.     Portions of this note were generated with [employer]. Dictation errors may occur despite best attempts at proofreading.   Final Clinical Impression(s) / ED Diagnoses Final diagnoses:  Other fatigue    Rx / DC Orders ED Discharge Orders     None         Janeece Fitting, PA-C 02/17/22 0011    Gareth Morgan, MD 02/17/22 [phone]

## 2022-02-16 NOTE — ED Triage Notes (Signed)
Pt arrives pov, slow gait, c/o "I just feeling really weak starting this morning". Reports HA upon waking.

## 2022-05-29 ENCOUNTER — Emergency Department (HOSPITAL_BASED_OUTPATIENT_CLINIC_OR_DEPARTMENT_OTHER)
Admission: EM | Admit: 2022-05-29 | Discharge: 2022-05-29 | Disposition: A | Discharge status: Home / Self Care | Payer: BC Managed Care – PPO | Attending: Emergency Medicine | Admitting: Emergency Medicine

## 2022-05-29 ENCOUNTER — Encounter (HOSPITAL_BASED_OUTPATIENT_CLINIC_OR_DEPARTMENT_OTHER): Payer: Self-pay

## 2022-05-29 ENCOUNTER — Emergency Department (HOSPITAL_BASED_OUTPATIENT_CLINIC_OR_DEPARTMENT_OTHER): Payer: BC Managed Care – PPO

## 2022-05-29 DIAGNOSIS — R310 Gross hematuria: Secondary | ICD-10-CM | POA: Insufficient documentation

## 2022-05-29 DIAGNOSIS — R1011 Right upper quadrant pain: Secondary | ICD-10-CM | POA: Diagnosis present

## 2022-05-29 DIAGNOSIS — N390 Urinary tract infection, site not specified: Secondary | ICD-10-CM | POA: Insufficient documentation

## 2022-05-29 DIAGNOSIS — B9689 Other specified bacterial agents as the cause of diseases classified elsewhere: Secondary | ICD-10-CM | POA: Insufficient documentation

## 2022-05-29 HISTORY — DX: Essential (primary) hypertension: I10

## 2022-05-29 LAB — CBC WITH DIFFERENTIAL/PLATELET
Abs Immature Granulocytes: 0.02 10*3/uL (ref 0.00–0.07)
Basophils Absolute: 0 10*3/uL (ref 0.0–0.1)
Basophils Relative: 0 %
Eosinophils Absolute: 0 10*3/uL (ref 0.0–0.5)
Eosinophils Relative: 0 %
HCT: 45.7 % (ref 39.0–52.0)
Hemoglobin: 15.3 g/dL (ref 13.0–17.0)
Immature Granulocytes: 0 %
Lymphocytes Relative: 13 %
Lymphs Abs: 1.6 10*3/uL (ref 0.7–4.0)
MCH: 27.3 pg (ref 26.0–34.0)
MCHC: 33.5 g/dL (ref 30.0–36.0)
MCV: 81.6 fL (ref 80.0–100.0)
Monocytes Absolute: 0.6 10*3/uL (ref 0.1–1.0)
Monocytes Relative: 5 %
Neutro Abs: 9.7 10*3/uL — ABNORMAL HIGH (ref 1.7–7.7)
Neutrophils Relative %: 82 %
Platelets: 188 10*3/uL (ref 150–400)
RBC: 5.6 MIL/uL (ref 4.22–5.81)
RDW: 12.7 % (ref 11.5–15.5)
WBC: 12 10*3/uL — ABNORMAL HIGH (ref 4.0–10.5)
nRBC: 0 % (ref 0.0–0.2)

## 2022-05-29 LAB — URINALYSIS, ROUTINE W REFLEX MICROSCOPIC

## 2022-05-29 LAB — LIPASE, BLOOD: Lipase: 27 U/L (ref 11–51)

## 2022-05-29 LAB — COMPREHENSIVE METABOLIC PANEL
ALT: 175 U/L — ABNORMAL HIGH (ref 0–44)
AST: 72 U/L — ABNORMAL HIGH (ref 15–41)
Albumin: 3.9 g/dL (ref 3.5–5.0)
Alkaline Phosphatase: 74 U/L (ref 38–126)
Anion gap: 8 (ref 5–15)
BUN: 11 mg/dL (ref 6–20)
CO2: 24 mmol/L (ref 22–32)
Calcium: 9 mg/dL (ref 8.9–10.3)
Chloride: 103 mmol/L (ref 98–111)
Creatinine, Ser: 0.75 mg/dL (ref 0.61–1.24)
GFR, Estimated: >60 mL/min (ref >60)
Glucose, Bld: 240 mg/dL — ABNORMAL HIGH (ref 70–99)
Potassium: 4.3 mmol/L (ref 3.5–5.1)
Sodium: 135 mmol/L (ref 135–145)
Total Bilirubin: 0.7 mg/dL (ref 0.3–1.2)
Total Protein: 7 g/dL (ref 6.5–8.1)

## 2022-05-29 LAB — URINALYSIS, MICROSCOPIC (REFLEX)
RBC / HPF: >50 RBC/hpf (ref 0–5)
WBC, UA: >50 WBC/hpf (ref 0–5)

## 2022-05-29 MED ORDER — ONDANSETRON HCL 4 MG/2ML IJ SOLN
4.0000 mg | Freq: Once | INTRAMUSCULAR | Status: AC
  Administered 2022-05-29: 4 mg via INTRAVENOUS
  Filled 2022-05-29: qty 2

## 2022-05-29 MED ORDER — CIPROFLOXACIN HCL 500 MG PO TABS: 500.0000 mg | ORAL_TABLET | Freq: Two times a day (BID) | ORAL | 0 refills | Status: AC

## 2022-05-29 MED ORDER — SODIUM CHLORIDE 0.9 % IV SOLN
1.0000 g | Freq: Once | INTRAVENOUS | Status: AC
  Administered 2022-05-29: 1 g via INTRAVENOUS
  Filled 2022-05-29: qty 10

## 2022-05-29 MED ORDER — MORPHINE SULFATE (PF) 4 MG/ML IV SOLN
4.0000 mg | Freq: Once | INTRAVENOUS | Status: AC
  Administered 2022-05-29: 4 mg via INTRAVENOUS
  Filled 2022-05-29: qty 1

## 2022-05-29 NOTE — ED Provider Notes (Signed)
[facility] EMERGENCY DEPARTMENT Provider Note   CSN: [id number] Arrival date & time: 05/29/22  2010     History  Chief Complaint  Patient presents with   Hematuria    Jonathan Morris is a 21 y.o. male presenting for hematuria. Went to the bathroom this morning at 4 AM and urine was mostly blood.  Urination has also been painful feels like its "on fire" and has only been able to urinate a minimal amount.  Endorses suprapubic pain and right upper quadrant pain.  States that both testicles feel painful.  States that "the whole family" has kidney stones.  Has not taken anything for his symptoms.  Denies trauma denies sexual activity.    Hematuria       Home Medications Prior to Admission medications   Medication Sig Start Date End Date Taking? Authorizing Provider  ciprofloxacin (CIPRO) 500 MG tablet Take 1 tablet (500 mg total) by mouth every 12 (twelve) hours. 05/29/22  Yes Harriet Pho, PA-C  AMBULATORY NON FORMULARY MEDICATION Medication Name: Gi Cocktail 5-10 cc every 6 hours as needed. Equal parts of Maalox, Liquid Donnatol or Bentyl, Viscous Lidocaine. 04/20/19   Jackquline Denmark, MD  FLUoxetine (PROZAC) 10 MG tablet Take 1 tablet by mouth daily.    [provider]  glimepiride (AMARYL) 4 MG tablet Take 4 mg by mouth 2 (two) times daily. 11/25/19   [provider]  metFORMIN (GLUCOPHAGE) 1000 MG tablet Take 1,000 mg by mouth 2 (two) times daily with a meal.    [provider]  omeprazole (PRILOSEC) 20 MG capsule Take 1 capsule (20 mg total) by mouth daily. 02/08/19   Blanchie Dessert, MD  ondansetron (ZOFRAN) 4 MG tablet Take 1 tablet (4 mg total) by mouth every 6 (six) hours. 12/15/16   Law, Bea Graff, PA-C  pantoprazole (PROTONIX) 40 MG tablet Take 1 tablet (40 mg total) by mouth daily. 1/2 hour before breakfast. 02/10/19   Jackquline Denmark, MD  pantoprazole (PROTONIX) 40 MG tablet Take 1 tablet (40 mg total) by mouth 2 (two) times  daily. 02/16/20   Jackquline Denmark, MD  prochlorperazine (COMPAZINE) 10 MG tablet Take 1 tablet (10 mg total) by mouth 2 (two) times daily as needed for nausea or vomiting. 08/18/19   Jackquline Denmark, MD  SITagliptin Phosphate (JANUVIA PO) Take by mouth.    [provider]  sucralfate (CARAFATE) 1 g tablet Take 1 tablet (1 g total) by mouth 4 (four) times daily -  with meals and at bedtime. 02/08/19   Blanchie Dessert, MD  traZODone (DESYREL) 50 MG tablet Take 1 tablet by mouth daily.    [provider]      Allergies    Patient has no known allergies.    Review of Systems   Review of Systems  Genitourinary:  Positive for hematuria.    Physical Exam Updated Vital Signs BP 132/75 (BP Location: Right Wrist)   Pulse 92   Temp 98.1 F (36.7 C) (Oral)   Resp 17   Ht 5' 11"  (1.803 m)   Wt (!) 170.1 kg   SpO2 98%   BMI 52.30 kg/m  Physical Exam Vitals and nursing note reviewed.  HENT:     Head: Normocephalic and atraumatic.     Mouth/Throat:     Mouth: Mucous membranes are moist.  Eyes:     General:        Right eye: No discharge.        Left eye: No  discharge.     Conjunctiva/sclera: Conjunctivae normal.  Cardiovascular:     Rate and Rhythm: Normal rate and regular rhythm.     Pulses: Normal pulses.     Heart sounds: Normal heart sounds.  Pulmonary:     Effort: Pulmonary effort is normal.     Breath sounds: Normal breath sounds.  Abdominal:     General: Abdomen is flat.     Palpations: Abdomen is soft.     Hernia: There is no hernia in the left inguinal area or right inguinal area.  Genitourinary:    Penis: No erythema, discharge or swelling.      Testes: Normal.        Right: Tenderness not present.        Left: Tenderness not present.     Epididymis:     Right: Normal.     Left: Normal.  Skin:    General: Skin is warm and dry.  Neurological:     General: No focal deficit present.  Psychiatric:        Mood and Affect: Mood normal.     ED  Results / Procedures / Treatments   Labs (all labs ordered are listed, but only abnormal results are displayed) Labs Reviewed  CBC WITH DIFFERENTIAL/PLATELET - Abnormal; Notable for the following components:      Result Value   WBC 12.0 (*)    Neutro Abs 9.7 (*)    All other components within normal limits  COMPREHENSIVE METABOLIC PANEL - Abnormal; Notable for the following components:   Glucose, Bld 240 (*)    AST 72 (*)    ALT 175 (*)    All other components within normal limits  URINALYSIS, ROUTINE W REFLEX MICROSCOPIC - Abnormal; Notable for the following components:   Color, Urine RED (*)    APPearance TURBID (*)    Glucose, UA   (*)    Value: TEST NOT REPORTED DUE TO COLOR INTERFERENCE OF URINE PIGMENT   Hgb urine dipstick   (*)    Value: TEST NOT REPORTED DUE TO COLOR INTERFERENCE OF URINE PIGMENT   Bilirubin Urine   (*)    Value: TEST NOT REPORTED DUE TO COLOR INTERFERENCE OF URINE PIGMENT   Ketones, ur   (*)    Value: TEST NOT REPORTED DUE TO COLOR INTERFERENCE OF URINE PIGMENT   Protein, ur   (*)    Value: TEST NOT REPORTED DUE TO COLOR INTERFERENCE OF URINE PIGMENT   Nitrite   (*)    Value: TEST NOT REPORTED DUE TO COLOR INTERFERENCE OF URINE PIGMENT   Leukocytes,Ua   (*)    Value: TEST NOT REPORTED DUE TO COLOR INTERFERENCE OF URINE PIGMENT   All other components within normal limits  URINALYSIS, MICROSCOPIC (REFLEX) - Abnormal; Notable for the following components:   Bacteria, UA FEW (*)    Non Squamous Epithelial PRESENT (*)    All other components within normal limits  URINE CULTURE  LIPASE, BLOOD  GC/CHLAMYDIA PROBE AMP ([place]) NOT AT [facility]    EKG None  Radiology CT RENAL STONE STUDY  Addendum Date: 05/29/2022   ADDENDUM REPORT: 05/29/2022 11:53 ADDENDUM: Please disregard the contents under the section "reproductive" in the previous dictation, it is an error. Reproductive: Prostate has a normal appearance. Electronically Signed   By: Frazier Richards  M.D.   On: 05/29/2022 11:53   Result Date: 05/29/2022 CLINICAL DATA:  Flank pain, kidney stone suspected; woke this a.m. with groin pain and hematuria  EXAM: CT ABDOMEN AND PELVIS WITHOUT CONTRAST TECHNIQUE: Multidetector CT imaging of the abdomen and pelvis was performed following the standard protocol without IV contrast. RADIATION DOSE REDUCTION: This exam was performed according to the departmental dose-optimization program which includes automated exposure control, adjustment of the mA and/or kV according to patient size and/or use of iterative reconstruction technique. COMPARISON:  None Available. FINDINGS: Lower chest: No acute abnormality. Hepatobiliary: No focal liver abnormality is seen. Hepatic steatosis. No gallstones, gallbladder wall thickening, or biliary dilatation. Pancreas: Unremarkable. No pancreatic ductal dilatation or surrounding inflammatory changes. Spleen: Normal in size without focal abnormality. Adrenals/Urinary Tract: Adrenal glands are unremarkable. Kidneys are normal, without renal calculi, focal lesion, or hydronephrosis. Bladder is unremarkable. Stomach/Bowel: Stomach is within normal limits. Appendix appears normal. No evidence of bowel wall thickening, distention, or inflammatory changes. Vascular/Lymphatic: No significant vascular findings are present. There are a few subcentimeter mesenteric lymph nodes with a representative lymph node at the right mid abdomen anterior to the IVC (image 61/2) measuring 8 mm and were seen on the previous study as well. Reproductive: Uterus and bilateral adnexa are unremarkable. Other: No abdominal wall hernia or abnormality. No abdominopelvic ascites. Musculoskeletal: Mild thoracic spondylosis and is most prominent at T10-T11 with prominent anterior endplate osteophytes. IMPRESSION: 1. No nephroureterolithiasis or hydroureteronephrosis on either side. Both kidneys have a normal appearance. Urinary bladder is unremarkable. 2. Subcentimeter  mesenteric lymphadenopathy without significant interval change. 3.  Hepatic steatosis. Electronically Signed: By: Frazier Richards M.D. On: 05/29/2022 11:10    Procedures Procedures    Medications Ordered in ED Medications  cefTRIAXone (ROCEPHIN) 1 g in sodium chloride 0.9 % 100 mL IVPB (has no administration in time range)  ondansetron (ZOFRAN) injection 4 mg (4 mg Intravenous Given 05/29/22 1031)  morphine (PF) 4 MG/ML injection 4 mg (4 mg Intravenous Given 05/29/22 1032)    ED Course/ Medical Decision Making/ A&P Clinical Course as of 05/29/22 1227  Thu May 29, 2022  1116 CT RENAL STONE STUDY [JR]    Clinical Course User Index [JR] Harriet Pho, PA-C                           Medical Decision Making Amount and/or Complexity of Data Reviewed Labs: ordered. Radiology: ordered. Decision-making details documented in ED Course.  Risk Prescription drug management.   This patient presents to the ED for concern of hematuria, this involves a number of treatment options, and is a complaint that carries with it a moderate risk of complications and morbidity.  The differential diagnosis includes pyelonephritis, acute cystitis, renal stone, STI and trauma.   Co morbidities: Discussed in HPI   EMR reviewed including pt PMHx, past surgical history and past visits to ER.   See HPI for more details   Lab Tests:   I ordered and independently interpreted labs. Labs notable for leukocytosis and bacteriuria   Imaging Studies:  Abnormal findings. I personally reviewed all imaging studies. Imaging notable for hepatic steatosis    Cardiac Monitoring:  The patient was maintained on a cardiac monitor.  I personally viewed and interpreted the cardiac monitored which showed an underlying rhythm of: sinus tachycardia EKG non-ischemic   Medicines ordered:  I ordered medication including morphine for pain and Zofran for nausea. Reevaluation of the patient after these medicines  showed that the patient improved I have reviewed the patients home medicines and have made adjustments as needed    Consults/Attending Physician   I  discussed this case with my attending physician who cosigned this note including patient's presenting symptoms, physical exam, and planned diagnostics and interventions. Attending physician stated agreement with plan or made changes to plan which were implemented.   Reevaluation:  After the interventions noted above I re-evaluated patient and found that they have :improved    Problem List / ED Course: Patient presented for hematuria.  Given strong family history of renal stone and right upper quadrant pain concern for passing renal stone.  CT however was unremarkable did reveal stable hepatic steatosis and unchanged mesenteric lymphadenopathy.  Labs revealed leukocytosis and bacteriuria.  In the setting of gross hematuria is concerning for UTI.  Treated with IV Rocephin.  Prescribed 5-day course of ciprofloxacin for outpatient management.  Advised to follow-up with PCP.  Discussed return precautions.   Dispostion:  After consideration of the diagnostic results and the patients response to treatment, I feel that the patent would benefit from discharge home with antibiotic treatment for UTI.         Final Clinical Impression(s) / ED Diagnoses Final diagnoses:  Lower urinary tract infectious disease  Gross hematuria    Rx / DC Orders ED Discharge Orders          Ordered    ciprofloxacin (CIPRO) 500 MG tablet  Every 12 hours        05/29/22 1217              Harriet Pho, PA-C 05/29/22 1227    Charlesetta Shanks, MD 05/31/22 1524

## 2022-05-29 NOTE — ED Triage Notes (Signed)
States woke up and has had blood in urine. States feels like he has to urinate but only a few drops comes out. Feels like he has to have a BM, but has had normal bowel movements.

## 2022-05-29 NOTE — ED Notes (Signed)
Notified lab of [place]

## 2022-05-29 NOTE — Discharge Instructions (Addendum)
Diagnosed with urinary tract infection.  Treated with IV Rocephin here in the emergency department.  Prescribed 5-day course of ciprofloxacin which is an antibiotic.  He is to take the entire course.  Advised to follow-up with your PCP.  If you have new abdominal pain, new fever, worsening bloody urine, return to the emergency department for further evaluation.

## 2022-05-29 NOTE — ED Notes (Signed)
Notified lab to add urine culture

## 2022-05-30 LAB — GC/CHLAMYDIA PROBE AMP (~~LOC~~) NOT AT ARMC
Chlamydia: NEGATIVE
Comment: NEGATIVE
Comment: NORMAL
Neisseria Gonorrhea: NEGATIVE

## 2022-05-30 LAB — URINE CULTURE: Culture: NO GROWTH

## 2022-11-24 ENCOUNTER — Other Ambulatory Visit: Payer: Self-pay

## 2022-11-24 ENCOUNTER — Emergency Department (HOSPITAL_BASED_OUTPATIENT_CLINIC_OR_DEPARTMENT_OTHER)
Admission: EM | Admit: 2022-11-24 | Discharge: 2022-11-25 | Disposition: A | Discharge status: Home / Self Care | Payer: BC Managed Care – PPO | Attending: Emergency Medicine | Admitting: Emergency Medicine

## 2022-11-24 ENCOUNTER — Encounter (HOSPITAL_BASED_OUTPATIENT_CLINIC_OR_DEPARTMENT_OTHER): Payer: Self-pay | Admitting: Urology

## 2022-11-24 ENCOUNTER — Emergency Department (HOSPITAL_BASED_OUTPATIENT_CLINIC_OR_DEPARTMENT_OTHER): Payer: BC Managed Care – PPO

## 2022-11-24 DIAGNOSIS — Z79899 Other long term (current) drug therapy: Secondary | ICD-10-CM | POA: Diagnosis not present

## 2022-11-24 DIAGNOSIS — R630 Anorexia: Secondary | ICD-10-CM | POA: Insufficient documentation

## 2022-11-24 DIAGNOSIS — E119 Type 2 diabetes mellitus without complications: Secondary | ICD-10-CM | POA: Diagnosis not present

## 2022-11-24 DIAGNOSIS — Z7984 Long term (current) use of oral hypoglycemic drugs: Secondary | ICD-10-CM | POA: Diagnosis not present

## 2022-11-24 DIAGNOSIS — R112 Nausea with vomiting, unspecified: Secondary | ICD-10-CM | POA: Insufficient documentation

## 2022-11-24 DIAGNOSIS — R0789 Other chest pain: Secondary | ICD-10-CM | POA: Diagnosis not present

## 2022-11-24 DIAGNOSIS — I10 Essential (primary) hypertension: Secondary | ICD-10-CM | POA: Insufficient documentation

## 2022-11-24 LAB — CBC
HCT: 46.3 % (ref 39.0–52.0)
Hemoglobin: 15.5 g/dL (ref 13.0–17.0)
MCH: 27.4 pg (ref 26.0–34.0)
MCHC: 33.5 g/dL (ref 30.0–36.0)
MCV: 81.9 fL (ref 80.0–100.0)
Platelets: 306 10*3/uL (ref 150–400)
RBC: 5.65 MIL/uL (ref 4.22–5.81)
RDW: 12.8 % (ref 11.5–15.5)
WBC: 12 10*3/uL — ABNORMAL HIGH (ref 4.0–10.5)
nRBC: 0 % (ref 0.0–0.2)

## 2022-11-24 LAB — BASIC METABOLIC PANEL
Anion gap: 8 (ref 5–15)
BUN: 9 mg/dL (ref 6–20)
CO2: 23 mmol/L (ref 22–32)
Calcium: 8.7 mg/dL — ABNORMAL LOW (ref 8.9–10.3)
Chloride: 103 mmol/L (ref 98–111)
Creatinine, Ser: 0.84 mg/dL (ref 0.61–1.24)
GFR, Estimated: >60 mL/min (ref >60)
Glucose, Bld: 113 mg/dL — ABNORMAL HIGH (ref 70–99)
Potassium: 3.8 mmol/L (ref 3.5–5.1)
Sodium: 134 mmol/L — ABNORMAL LOW (ref 135–145)

## 2022-11-24 LAB — TROPONIN I (HIGH SENSITIVITY)
Troponin I (High Sensitivity): 2 ng/L
Troponin I (High Sensitivity): <2 ng/L

## 2022-11-24 MED ORDER — KETOROLAC TROMETHAMINE 15 MG/ML IJ SOLN
15.0000 mg | Freq: Once | INTRAMUSCULAR | Status: AC
Start: 2022-11-24 — End: 2022-11-24
  Administered 2022-11-24: 15 mg via INTRAVENOUS
  Filled 2022-11-24: qty 1

## 2022-11-24 MED ORDER — ACETAMINOPHEN 500 MG PO TABS
1000.0000 mg | ORAL_TABLET | Freq: Once | ORAL | Status: AC
Start: 2022-11-24 — End: 2022-11-24
  Administered 2022-11-24: 1000 mg via ORAL
  Filled 2022-11-24: qty 2

## 2022-11-24 MED ORDER — ONDANSETRON HCL 4 MG/2ML IJ SOLN
4.0000 mg | Freq: Four times a day (QID) | INTRAMUSCULAR | Status: DC | PRN
  Administered 2022-11-24: 4 mg via INTRAVENOUS
  Filled 2022-11-24: qty 2

## 2022-11-24 MED ORDER — LACTATED RINGERS IV BOLUS
1000.0000 mL | Freq: Once | INTRAVENOUS | Status: AC
  Administered 2022-11-24: 1000 mL via INTRAVENOUS

## 2022-11-24 NOTE — ED Notes (Signed)
Patient transported to X-ray 

## 2022-11-24 NOTE — ED Triage Notes (Addendum)
Pt states central chest pain that radiates into jaw that started approx 1 hr ago  States mild SOB with exertion CBG 120 at home   Pt states " I have a broken heart because my girlfriend left me" , pt states he has been depressed but denies SI/HI  H/o htn and DM, not taking meds

## 2022-11-24 NOTE — ED Provider Notes (Signed)
[facility] Provider Note   CSN: [id number] Arrival date & time: 11/24/22  1920     History  Chief Complaint  Patient presents with   Chest Pain    Jonathan Morris is a 22 y.o. male with T2DM, anxiety, hypertension who presents with chest pain.   Patient reports 8 out of 10 intermittent central chest pain radiating into his bilateral jaw that started this morning.  No history of similar.  He states he and his girlfriend broke up on Saturday and he has had anorexia with nausea and vomiting since that time.  He states he did not want to come to the hospital but his mother made him come.  He denies any fever/chills, cough, shortness of breath, abdominal pain, N/V, lower extremity swelling. Denies HI/SI. Has T2DM and HTN but doesn't take any medicines daily. No h/o CAD/MI.    Chest Pain      Home Medications Prior to Admission medications   Medication Sig Start Date End Date Taking? Authorizing Provider  AMBULATORY NON FORMULARY MEDICATION Medication Name: Gi Cocktail 5-10 cc every 6 hours as needed. Equal parts of Maalox, Liquid Donnatol or Bentyl, Viscous Lidocaine. 04/20/19   Jackquline Denmark, MD  ciprofloxacin (CIPRO) 500 MG tablet Take 1 tablet (500 mg total) by mouth every 12 (twelve) hours. 05/29/22   Harriet Pho, PA-C  FLUoxetine (PROZAC) 10 MG tablet Take 1 tablet by mouth daily.    [provider]  glimepiride (AMARYL) 4 MG tablet Take 4 mg by mouth 2 (two) times daily. 11/25/19   [provider]  metFORMIN (GLUCOPHAGE) 1000 MG tablet Take 1,000 mg by mouth 2 (two) times daily with a meal.    [provider]  omeprazole (PRILOSEC) 20 MG capsule Take 1 capsule (20 mg total) by mouth daily. 02/08/19   Blanchie Dessert, MD  ondansetron (ZOFRAN) 4 MG tablet Take 1 tablet (4 mg total) by mouth every 6 (six) hours. 12/15/16   Law, Bea Graff, PA-C  pantoprazole (PROTONIX) 40 MG tablet Take 1 tablet (40  mg total) by mouth daily. 1/2 hour before breakfast. 02/10/19   Jackquline Denmark, MD  pantoprazole (PROTONIX) 40 MG tablet Take 1 tablet (40 mg total) by mouth 2 (two) times daily. 02/16/20   Jackquline Denmark, MD  prochlorperazine (COMPAZINE) 10 MG tablet Take 1 tablet (10 mg total) by mouth 2 (two) times daily as needed for nausea or vomiting. 08/18/19   Jackquline Denmark, MD  SITagliptin Phosphate (JANUVIA PO) Take by mouth.    [provider]  sucralfate (CARAFATE) 1 g tablet Take 1 tablet (1 g total) by mouth 4 (four) times daily -  with meals and at bedtime. 02/08/19   Blanchie Dessert, MD  traZODone (DESYREL) 50 MG tablet Take 1 tablet by mouth daily.    [provider]      Allergies    Patient has no known allergies.    Review of Systems   Review of Systems  Cardiovascular:  Positive for chest pain.   Review of systems Negative for f/c.  A 10 point review of systems was performed and is negative unless otherwise reported in HPI.  Physical Exam Updated Vital Signs BP 105/84   Pulse 68   Temp 98 F (36.7 C) (Oral)   Resp 12   Ht '5\' 11"'$  (075-GRM m)   Wt (!) 163.3 kg   SpO2 98%   BMI 50.21 kg/m  Physical Exam General: Normal appearing male,  lying in bed.  HEENT: PERRLA, Sclera anicteric, MMM, trachea midline. No cervical LAD palpable, no neck swelling, no parotid swelling, no masses palpated in neck. Cardiology: RRR, no murmurs/rubs/gallops. BL radial and DP pulses equal bilaterally.  Resp: Normal respiratory rate and effort. CTAB, no wheezes, rhonchi, crackles.  Abd: Soft, non-tender, non-distended. No rebound tenderness or guarding.  GU: Deferred. MSK: No peripheral edema or signs of trauma. Extremities without deformity or TTP. No cyanosis or clubbing. Skin: warm, dry. No rashes or lesions. Back: No CVA tenderness Neuro: A&Ox4, CNs II-XII grossly intact. MAEs. Sensation grossly intact.  Psych: Normal mood and affect.   ED Results / Procedures / Treatments    Labs (all labs ordered are listed, but only abnormal results are displayed) Labs Reviewed  BASIC METABOLIC PANEL - Abnormal; Notable for the following components:      Result Value   Sodium 134 (*)    Glucose, Bld 113 (*)    Calcium 8.7 (*)    All other components within normal limits  CBC - Abnormal; Notable for the following components:   WBC 12.0 (*)    All other components within normal limits  TROPONIN I (HIGH SENSITIVITY)  TROPONIN I (HIGH SENSITIVITY)    EKG EKG Interpretation  Date/Time:  Monday November 24 2022 19:26:32 EST Ventricular Rate:  104 PR Interval:  131 QRS Duration: 95 QT Interval:  331 QTC Calculation: 423 R Axis:   50 Text Interpretation: Sinus tachycardia Confirmed by Cindee Lame [phone]) on 11/24/2022 7:51:37 PM Also confirmed by Cindee Lame [phone]), editor Stetler, Angela [phone])  on 11/25/2022 8:33:51 AM  Radiology See ED course     EMERGENCY DEPARTMENT [place] CARDIAC EXAM "Study: Limited Ultrasound of the Heart and Pericardium"  INDICATIONS:Chest pain Multiple views of the heart and pericardium were obtained in real-time with a multi-frequency probe.  PERFORMED [id number] IMAGES ARCHIVED?: Yes LIMITATIONS:  Body habitus VIEWS USED: Parasternal long axis, Parasternal short axis, and Apical 4 chamber  INTERPRETATION: Cardiac activity present, Pericardial effusioin absent, Cardiac tamponade absent, and Normal contractility   Procedures Procedures    Medications Ordered in ED Medications  acetaminophen (TYLENOL) tablet 1,000 mg (1,000 mg Oral Given 11/24/22 2203)  ketorolac (TORADOL) 15 MG/ML injection 15 mg (15 mg Intravenous Given 11/24/22 2201)  lactated ringers bolus 1,000 mL (0 mLs Intravenous Stopped 11/24/22 2335)    ED Course/ Medical Decision Making/ A&P                          Medical Decision Making Amount and/or Complexity of Data Reviewed Labs: ordered. Decision-making details documented in ED Course. Radiology: ordered.  Decision-making details documented in ED Course.  Risk OTC drugs. Prescription drug management.    This patient presents to the ED for concern of chest pain, this involves an extensive number of treatment options, and is a complaint that carries with it a high risk of complications and morbidity.  I considered the following differential and admission for this acute, potentially life threatening condition. Overall patient is very well appearing and HDS however.  MDM:    DDX for chest pain includes but is not limited to:  ACS/arrhythmia,  PE, PNA, biliary disease, cardiac tamponade, pericarditis, GERD/PUD/gastritis, or musculoskeletal pain. Very low suspicion for ACS vs aortic dissection given presenting sx. Patient PERCs out for PE, minimal risk factors for PE. No c/f dissection. No abdominal pain and no c/f biliary disease. Consider viral syndrome. In s/o significant emotional distress with chest  pain must consider Takutsubo's cardiomyopathy, however BSUS demonstrated normal cardiac function with no dilated cardiomyopathy. No pericardial effusion, normal contractility. Consider PNA will obtain CXR.   Clinical Course as of 12/03/22 1716  Mon Nov 24, 2022  1952 WBC(!): 12.0 [HN]  1952 DG Chest 2 View FINDINGS: The heart size and mediastinal contours are within normal limits. Both lungs are clear. The visualized skeletal structures are unremarkable.  IMPRESSION: No active cardiopulmonary disease.   [HN]  2032 Troponin I (High Sensitivity): 2 [HN]  99991111 Basic metabolic panel(!) [HN]  [id number] BSUS demonstrates no pericardial effusion, normal EF, no dilated cardiomyopathy. [HN]  2322 Patient is signed out to the oncoming ED physician Dr. Stark Jock who is made aware of his history, presentation, exam, workup, and plan.  Plan is to obtain 2nd troponin. If negative, patient can be DC'd w/ o/p f/u.  [HN]    Clinical Course User Index [HN] Audley Hose, MD    Labs: I Ordered, and personally  interpreted labs.  The pertinent results include:  those listed above  Imaging Studies ordered: I ordered imaging studies including CXR I independently visualized and interpreted imaging. I agree with the radiologist interpretation  Additional history obtained from chart review  Cardiac Monitoring: The patient was maintained on a cardiac monitor.  I personally viewed and interpreted the cardiac monitored which showed an underlying rhythm of: NSR  Reevaluation: After the interventions noted above, I reevaluated the patient and found that they have :improved  Social Determinants of Health: Patient lives independently  Disposition:  Patient is signed out to the oncoming ED physician who is made aware of her history, presentation, exam, workup, and plan. If2nd trop neg, likely viral syndrome and can be dc'd w/ PCP f/u.   Co morbidities that complicate the patient evaluation  Past Medical History:  Diagnosis Date   ADHD (attention deficit hyperactivity disorder)    Diabetes mellitus without complication ([place])    Fatty liver    GERD (gastroesophageal reflux disease)    Hypertension    Morbid obesity ([place])      Medicines Meds ordered this encounter  Medications   acetaminophen (TYLENOL) tablet 1,000 mg   ketorolac (TORADOL) 15 MG/ML injection 15 mg   lactated ringers bolus 1,000 mL   DISCONTD: ondansetron (ZOFRAN) injection 4 mg    I have reviewed the patients home medicines and have made adjustments as needed  Problem List / ED Course: Problem List Items Addressed This Visit   None Visit Diagnoses     Atypical chest pain    -  Primary                   This note was created using dictation software, which may contain spelling or grammatical errors.    Audley Hose, MD 12/03/22 [phone]

## 2022-11-24 NOTE — ED Notes (Signed)
Pt. States that his L jaw has been hurting and he has had L side chest pain today.  Pt. Reports that he has not been eating or drinking for 2 days because his  girlfriend broke up with him.

## 2022-11-25 NOTE — ED Provider Notes (Signed)
  Physical Exam  BP 109/86 (BP Location: Left Arm)   Pulse 80   Temp 98.2 F (36.8 C) (Oral)   Resp 20   Ht 5' 11"$  (1.803 m)   Wt (!) 163.3 kg   SpO2 94%   BMI 50.21 kg/m   Physical Exam Vitals and nursing note reviewed.  Constitutional:      Appearance: He is well-developed.  HENT:     Head: Normocephalic and atraumatic.  Pulmonary:     Effort: Pulmonary effort is normal.  Skin:    General: Skin is warm and dry.  Neurological:     Mental Status: He is alert.     Procedures  Procedures  ED Course / MDM   Clinical Course as of 11/25/22 0031  Mon Nov 24, 2022  1952 WBC(!): 12.0 [HN]  1952 DG Chest 2 View FINDINGS: The heart size and mediastinal contours are within normal limits. Both lungs are clear. The visualized skeletal structures are unremarkable.  IMPRESSION: No active cardiopulmonary disease.   [HN]  2032 Troponin I (High Sensitivity): 2 [HN]  99991111 Basic metabolic panel(!) [HN]  [id number] BSUS demonstrates no pericardial effusion, normal EF, no dilated cardiomyopathy. [HN]  2322 Patient is signed out to the oncoming ED physician Dr. Stark Jock who is made aware of his history, presentation, exam, workup, and plan.  Plan is to obtain 2nd troponin. If negative, patient can be DC'd w/ o/p f/u.  [HN]    Clinical Course User Index [HN] Audley Hose, MD    Care assumed from Dr. Mayra Neer at shift change.  Patient awaiting results of a delta troponin.  He presents here initially with complaints of chest discomfort that seems atypical for cardiac pain.  He has no cardiac risk factors.  Care signed out to me awaiting results of a delta troponin.  This has returned and is normal.  Patient is feeling better and I believe can safely be discharged.  I highly doubt a cardiac etiology or other emergent process.  Workup is otherwise unremarkable.      Veryl Speak, MD 11/25/22 [phone]

## 2022-11-25 NOTE — Discharge Instructions (Signed)
Take ibuprofen 600 mg every 6 hours as needed for pain.  Follow-up with primary doctor if not improving in the next week, and return to the ER if symptoms significantly worsen or change.

## 2022-12-12 IMAGING — CT CT ABD-PELV W/ CM
2 of 5 series · 15 of 46 positions shown, 17 images · IV contrast (Omnipaque)
Comparison: Chest, abdomen and pelvis CT 07/16/2019

CLINICAL DATA: Groin area abscess.

EXAM:
CT ABDOMEN AND PELVIS WITH CONTRAST
TECHNIQUE: Multidetector CT imaging of the abdomen and pelvis was performed
using the standard protocol following bolus administration of
intravenous contrast.

[Series 2: axial st · axial · 0.92mm/px · z∈[-528,-33]mm · 12 of 117 slices shown, 14 images]
[im 9/117  soft-tissue]
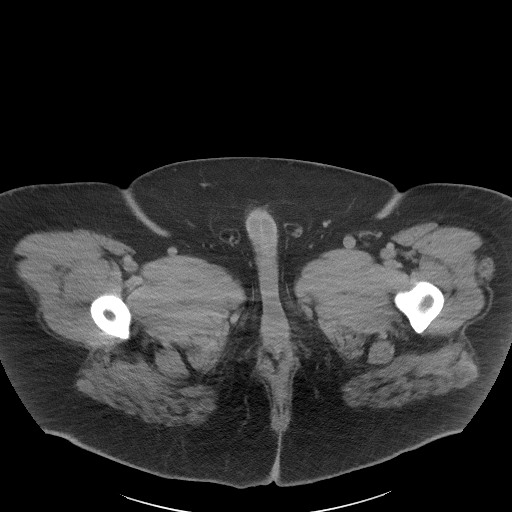
[im 9/117  bone]
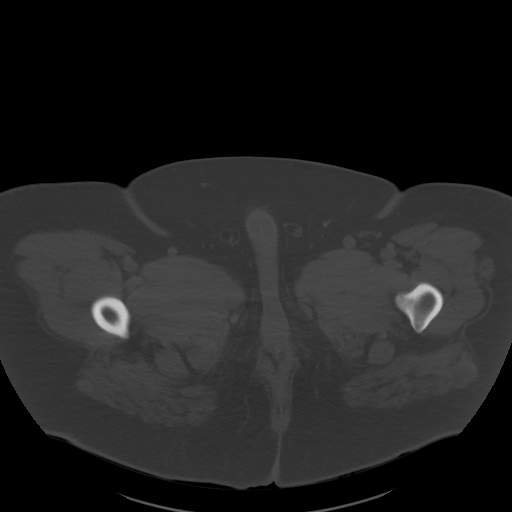
[im 17/117  soft-tissue]
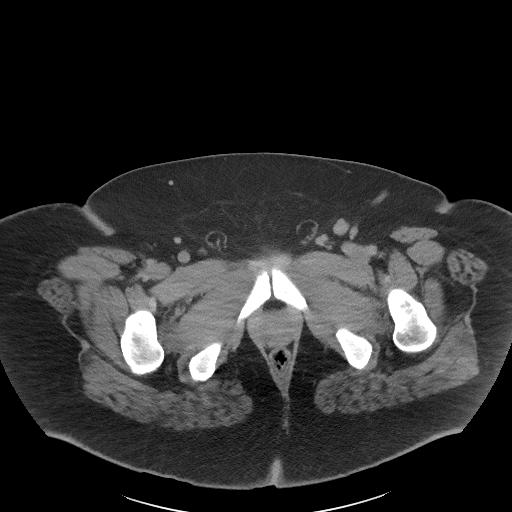
[im 25/117  soft-tissue]
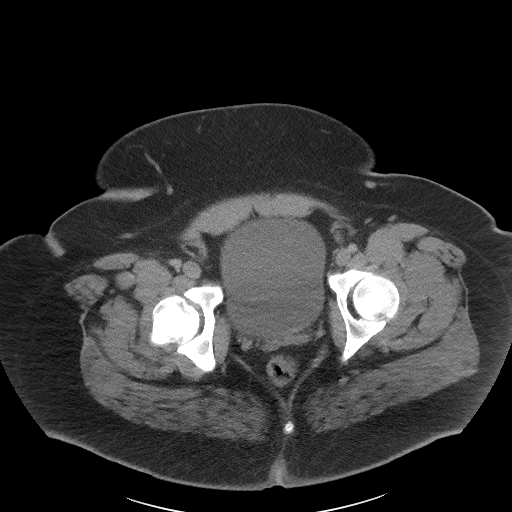
[im 34/117  soft-tissue]
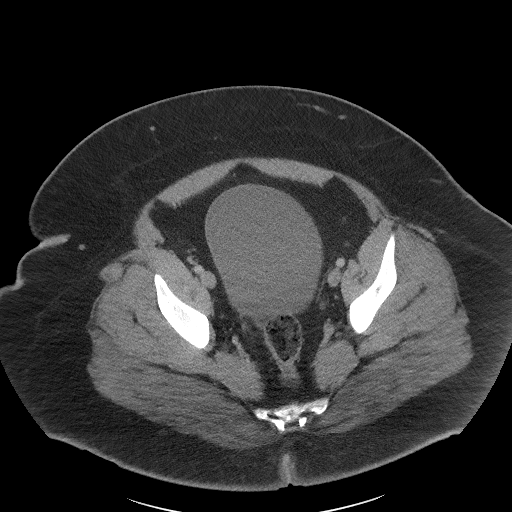
[im 42/117  soft-tissue]
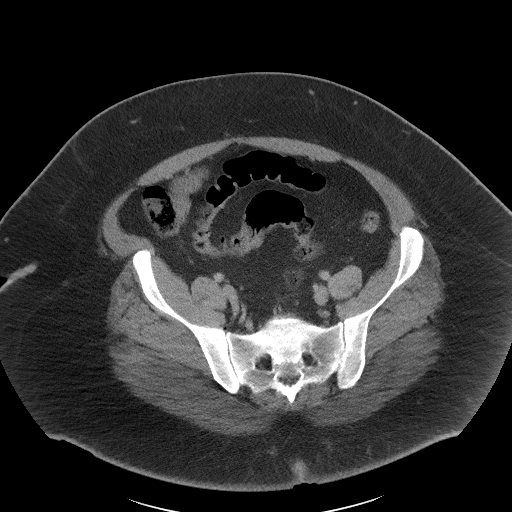
[im 50/117  soft-tissue]
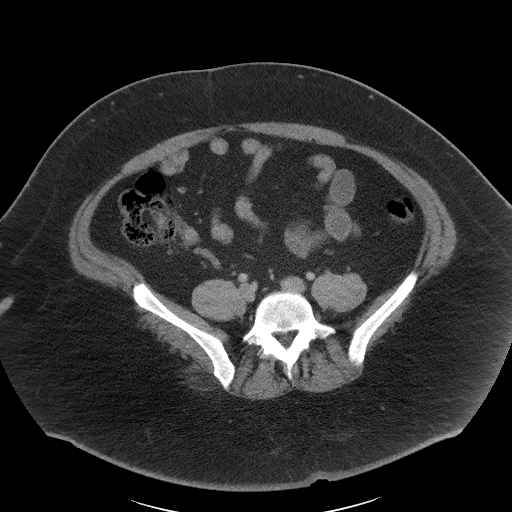
[im 67/117  soft-tissue]
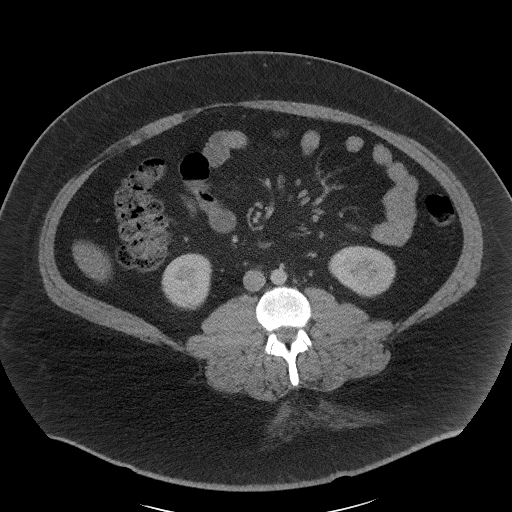
[im 75/117  soft-tissue]
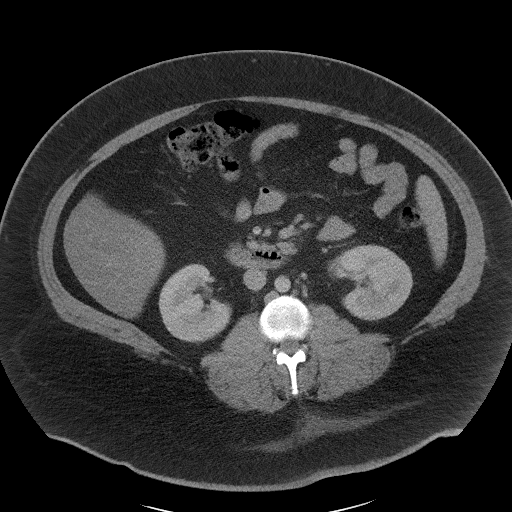
[im 83/117  soft-tissue]
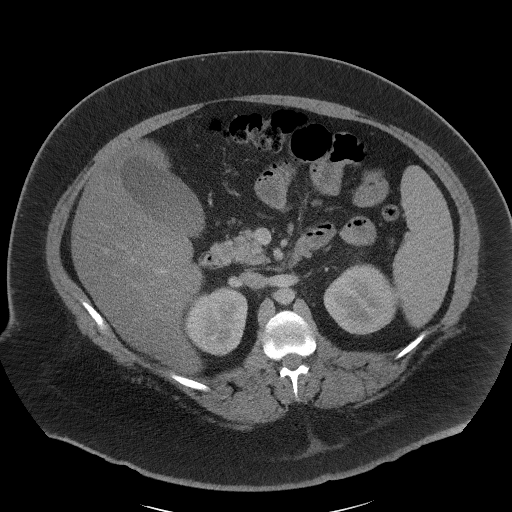
[im 83/117  bone]
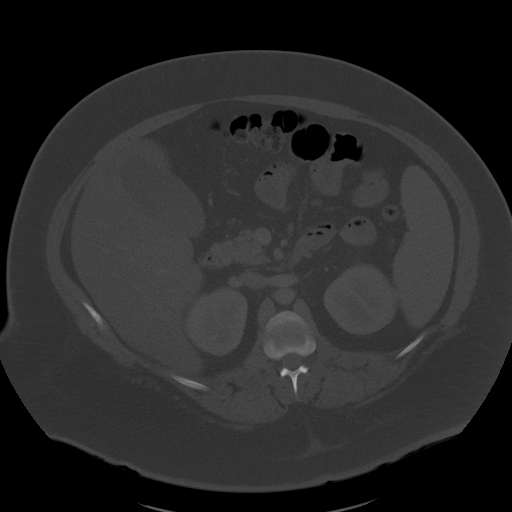
[im 92/117  soft-tissue]
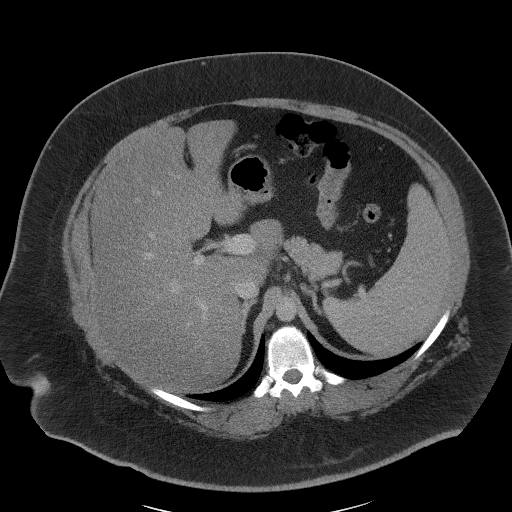
[im 100/117  soft-tissue]
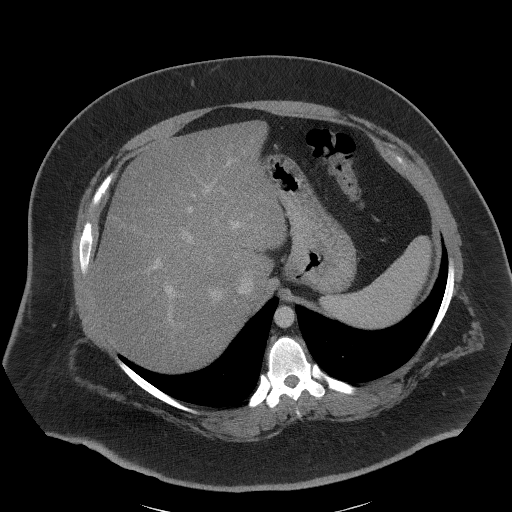
[im 108/117  soft-tissue]
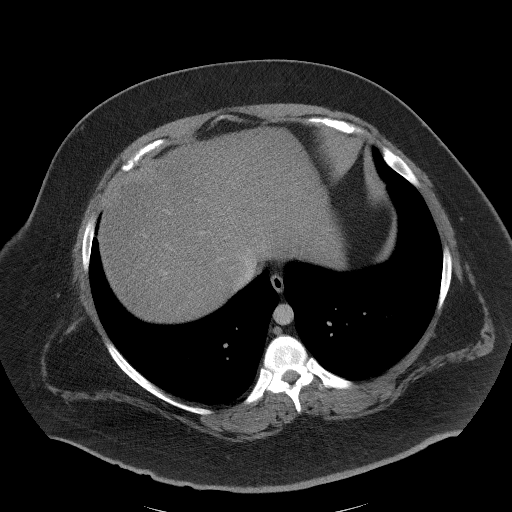

[Series 5: coronal st · coronal · 0.98mm/px · 3 of 139 slices shown]
[im 47/139  soft-tissue]
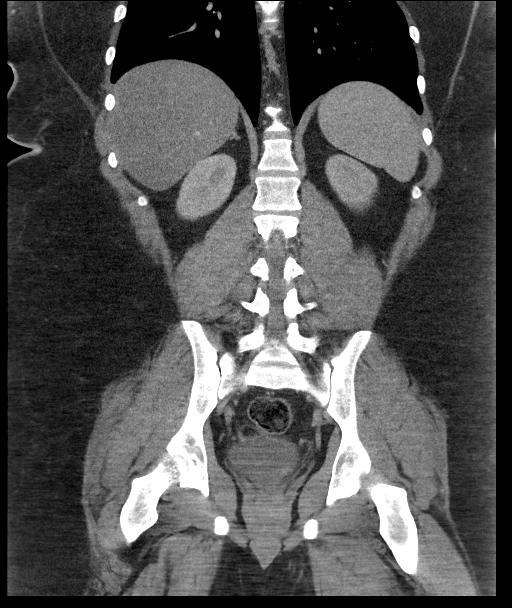
[im 62/139  soft-tissue]
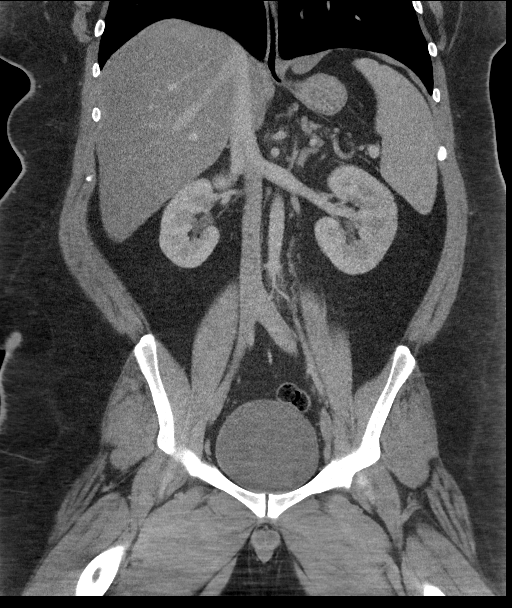
[im 77/139  soft-tissue]
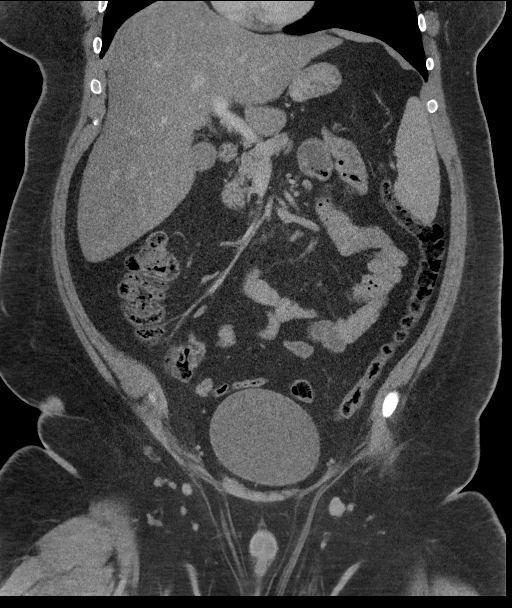

[15 of 46 positions shown; findings below may reference images not displayed]

RADIATION DOSE REDUCTION: This exam was performed according to the
departmental dose-optimization program which includes automated
exposure control, adjustment of the mA and/or kV according to
patient size and/or use of iterative reconstruction technique.

CONTRAST:  100mL OMNIPAQUE IOHEXOL 300 MG/ML  SOLN
FINDINGS: Lower chest: No acute abnormality. Asymmetrically elevated right
hemidiaphragm again noted.

Hepatobiliary: Moderately steatotic enlarged liver, measures 27.4 cm
length. No mass enhancement. Unremarkable gallbladder and bile
ducts.

Pancreas: Unremarkable.

Spleen: Enlarged, measuring 17.7 by 16.5 by 5.9 cm, not
significantly changed. No mass enhancement.

Adrenals/Urinary Tract: There is no adrenal or renal mass
enhancement, no urinary stone or obstruction no bladder thickening.

Stomach/Bowel: No dilatation or wall thickening. The appendix is
normal. There is mild constipation and diverticulosis. No
inflammatory changes or focal colitis.

Vascular/Lymphatic: There is increasing prominence of the hepatic
portal vein which measures 18 mm. The aorta is normal. There are
prominent left inguinal chain nodes up to 1.3 cm in short axis and
similar prominent left obturator and left external iliac chain
nodes. Stable clustered borderline prominent mesenteric root nodes.

Reproductive: Normal prostate.

Other: There is asymmetric skin thickening, swelling and fatty
induration beginning left of the midline in the mons pubis extending
to the left-sided scrotal wall which is asymmetrically thickened. No
focal fluid collection is seen consistent with a drainable abscess.

The findings are consistent with cellulitis. There is no visible
extension of the process into the perineum, buttocks and upper
thighs at this time. There are small inguinal fat hernias.

Musculoskeletal: There is chronic mild anterior wedging of the T11
and T10 vertebrae with intervening anterior endplate osteophytes. No
acute or further significant osseous findings.
IMPRESSION: 1. Skin thickening and induration beginning left of the midline in
the mons pubis extending to the left lateral scrotal wall which is
asymmetrically thicker. The findings are consistent with cellulitis
without a drainable abscess or focal fluid collection.
2. There is reactive left inguinal and left pelvic wall adenopathy.
3. There is no visible extension of the process posteriorly into the
perineal spaces, buttocks and upper thighs at this time. There is no
appreciable soft tissue gas.
4. Hepatosplenomegaly with moderate hepatic steatosis and prominent
portal vein.
5. Chronic wedging of the T10 and 11 vertebrae.
6. Small inguinal fat hernias.

## 2023-01-23 IMAGING — CT CT ABD-PELV W/ CM
2 of 4 series · 16 of 46 positions shown, 18 images · IV contrast (agent unspecified)
Comparison: CT abdomen pelvis dated 01/05/2022.

CLINICAL DATA: Abdominal pain.

EXAM:
CT ABDOMEN AND PELVIS WITH CONTRAST
TECHNIQUE: Multidetector CT imaging of the abdomen and pelvis was performed
using the standard protocol following bolus administration of
intravenous contrast.

[Series 2: axial st · axial · 0.98mm/px · z∈[-612,-102]mm · 13 of 114 slices shown, 15 images]
[im 6/114  soft-tissue]
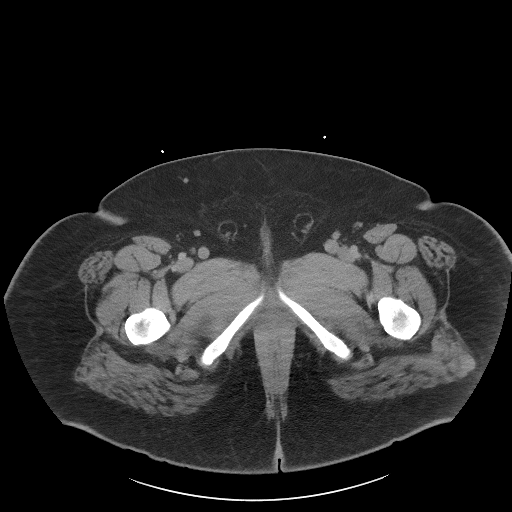
[im 6/114  bone]
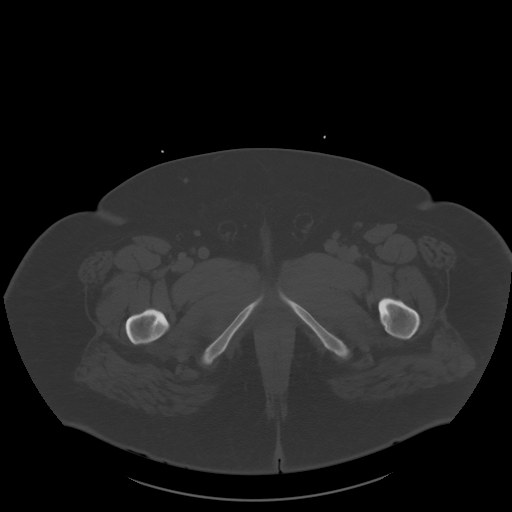
[im 16/114  soft-tissue]
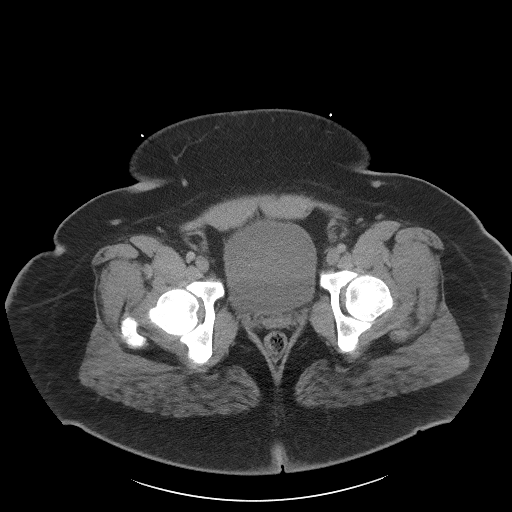
[im 26/114  soft-tissue]
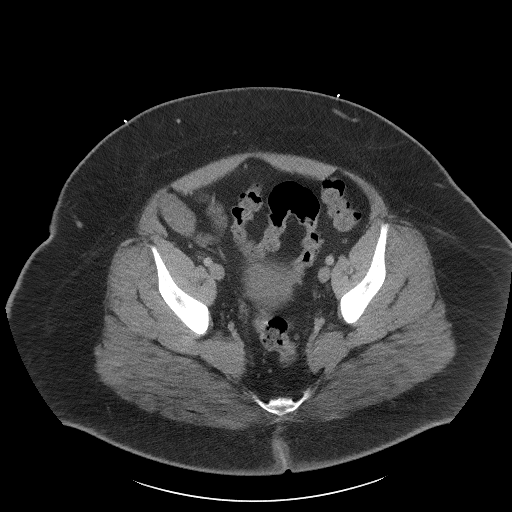
[im 31/114  soft-tissue]
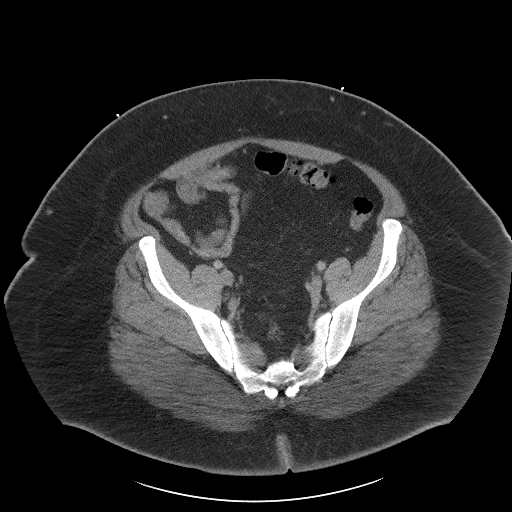
[im 42/114  soft-tissue]
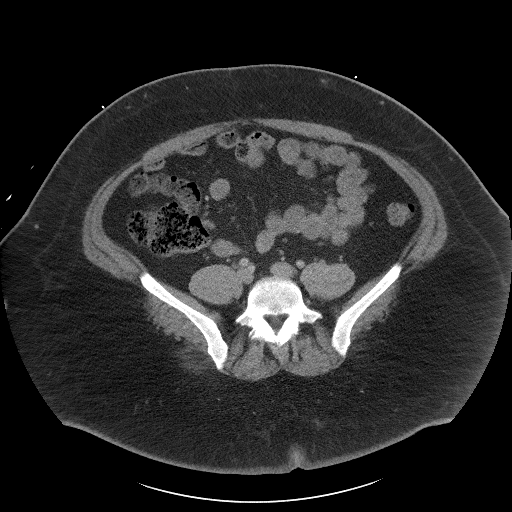
[im 47/114  soft-tissue]
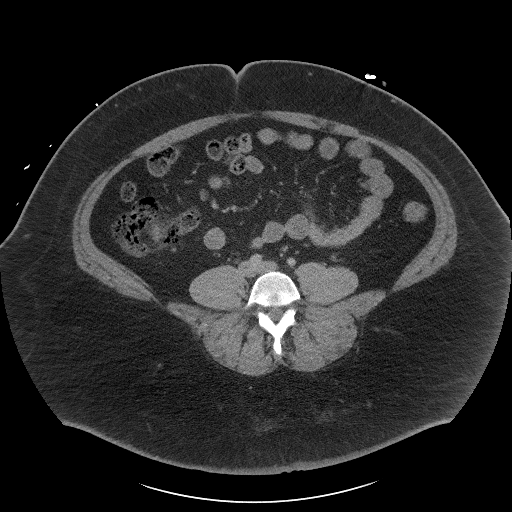
[im 57/114  soft-tissue]
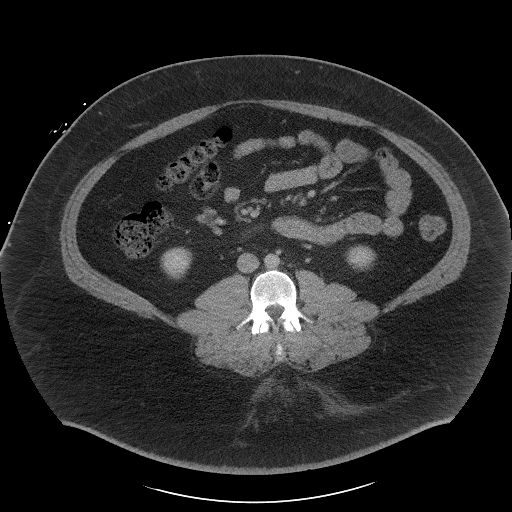
[im 67/114  soft-tissue]
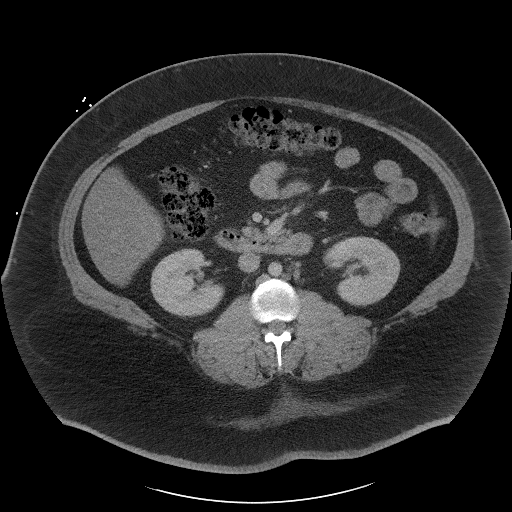
[im 72/114  soft-tissue]
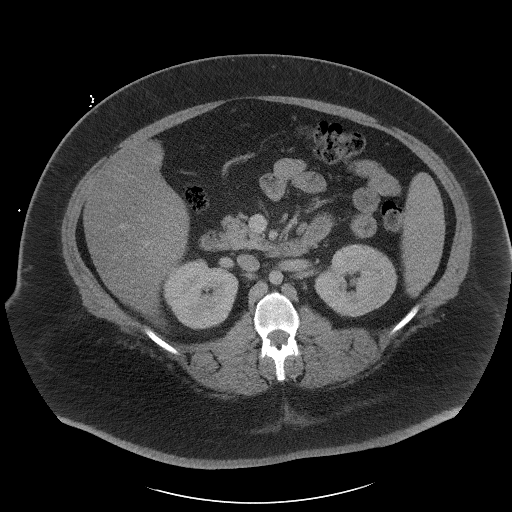
[im 72/114  bone]
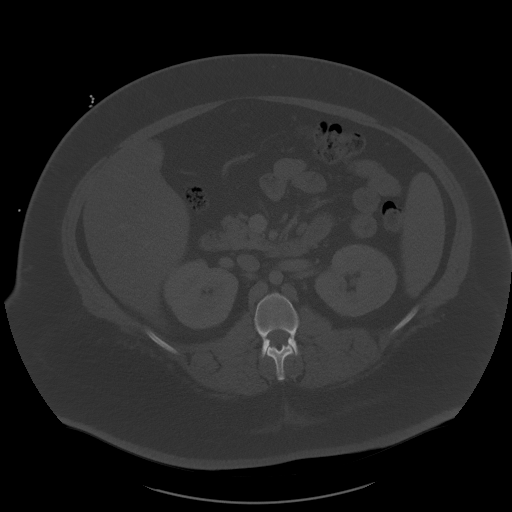
[im 83/114  soft-tissue]
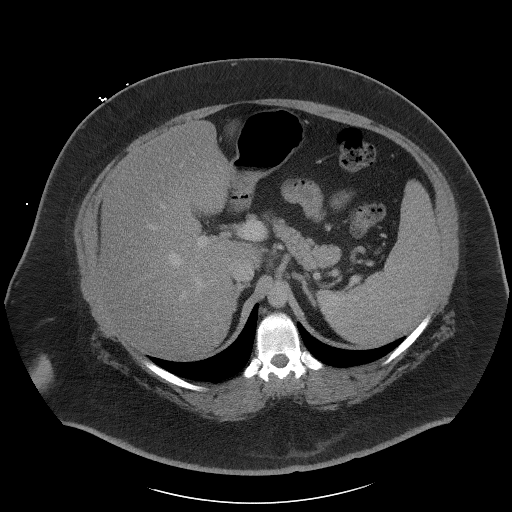
[im 88/114  soft-tissue]
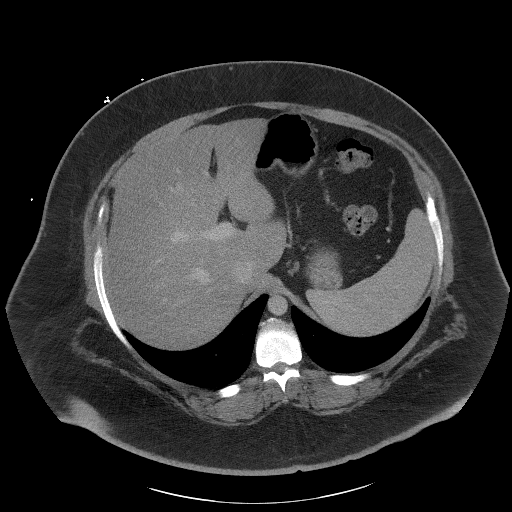
[im 98/114  soft-tissue]
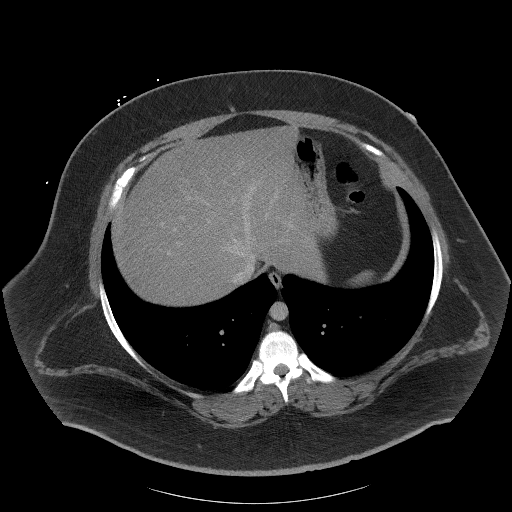
[im 108/114  soft-tissue]
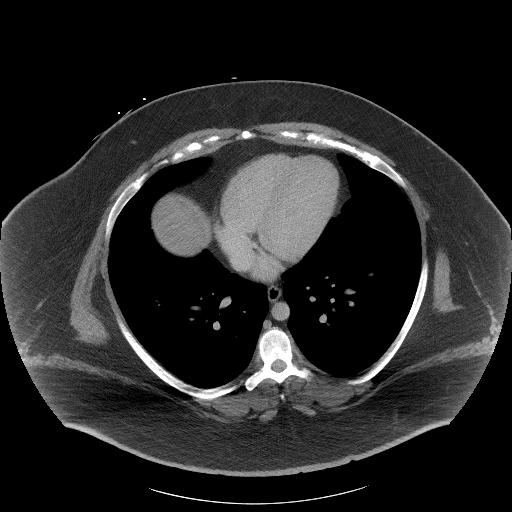

[Series 5: coronal st · coronal · 1.05mm/px · 3 of 142 slices shown]
[im 48/142  soft-tissue]
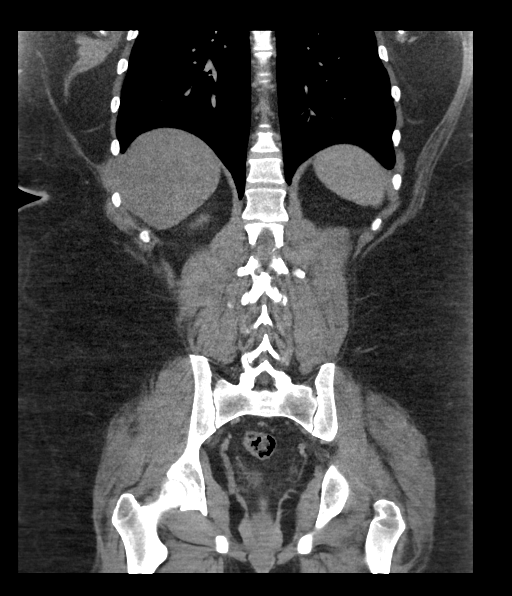
[im 63/142  soft-tissue]
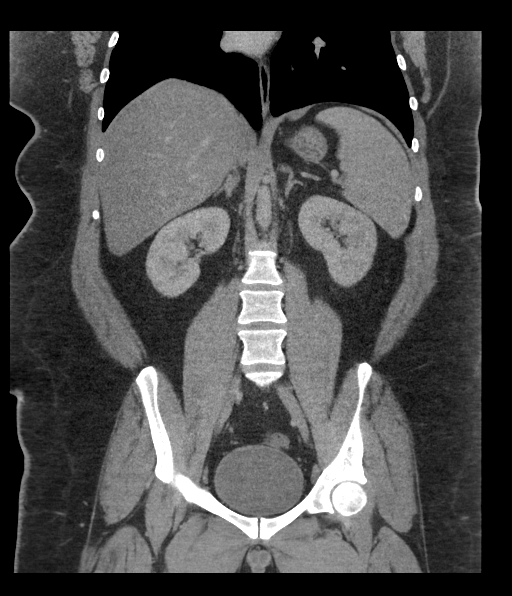
[im 79/142  soft-tissue]
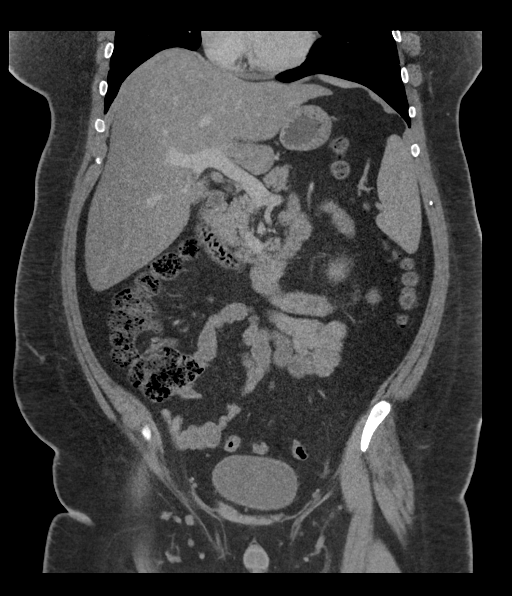

[16 of 46 positions shown; findings below may reference images not displayed]

RADIATION DOSE REDUCTION: This exam was performed according to the
departmental dose-optimization program which includes automated
exposure control, adjustment of the mA and/or kV according to
patient size and/or use of iterative reconstruction technique.

CONTRAST:  100mL OMNIPAQUE IOHEXOL 300 MG/ML  SOLN
FINDINGS: Lower chest: The visualized lung bases are clear.

No intra-abdominal free air or free fluid.

Hepatobiliary: Diffuse fatty liver. No intrahepatic biliary
dilatation. The gallbladder is unremarkable.

Pancreas: Unremarkable. No pancreatic ductal dilatation or
surrounding inflammatory changes.

Spleen: Normal in size without focal abnormality.

Adrenals/Urinary Tract: The adrenal glands are unremarkable. The
kidneys, visualized ureters, and urinary bladder appear
unremarkable.

Stomach/Bowel: There is no bowel obstruction or active inflammation.
The appendix is normal.

Vascular/Lymphatic: The abdominal aorta and IVC are unremarkable. No
portal venous gas. There is no adenopathy.

Reproductive: The prostate and seminal vesicles are grossly
remarkable. No pelvic mass.

Other: None

Musculoskeletal: No acute or significant osseous findings.
IMPRESSION: 1. No acute intra-abdominal or pelvic pathology.
2. Fatty liver.

## 2023-03-21 ENCOUNTER — Emergency Department (HOSPITAL_COMMUNITY): Payer: BC Managed Care – PPO

## 2023-03-21 ENCOUNTER — Emergency Department (HOSPITAL_COMMUNITY)
Admission: EM | Admit: 2023-03-21 | Discharge: 2023-03-21 | Disposition: A | Discharge status: Home / Self Care | Payer: BC Managed Care – PPO | Attending: Emergency Medicine | Admitting: Emergency Medicine

## 2023-03-21 DIAGNOSIS — M549 Dorsalgia, unspecified: Secondary | ICD-10-CM | POA: Diagnosis not present

## 2023-03-21 DIAGNOSIS — R079 Chest pain, unspecified: Secondary | ICD-10-CM | POA: Diagnosis not present

## 2023-03-21 DIAGNOSIS — R1013 Epigastric pain: Secondary | ICD-10-CM

## 2023-03-21 DIAGNOSIS — Z794 Long term (current) use of insulin: Secondary | ICD-10-CM | POA: Diagnosis not present

## 2023-03-21 LAB — CBC
HCT: 44.7 % (ref 39.0–52.0)
Hemoglobin: 14.7 g/dL (ref 13.0–17.0)
MCH: 27.6 pg (ref 26.0–34.0)
MCHC: 32.9 g/dL (ref 30.0–36.0)
MCV: 83.9 fL (ref 80.0–100.0)
Platelets: 253 10*3/uL (ref 150–400)
RBC: 5.33 MIL/uL (ref 4.22–5.81)
RDW: 12.6 % (ref 11.5–15.5)
WBC: 10.6 10*3/uL — ABNORMAL HIGH (ref 4.0–10.5)
nRBC: 0 % (ref 0.0–0.2)

## 2023-03-21 LAB — HEPATIC FUNCTION PANEL
ALT: 42 U/L (ref 0–44)
AST: 20 U/L (ref 15–41)
Albumin: 3.6 g/dL (ref 3.5–5.0)
Alkaline Phosphatase: 64 U/L (ref 38–126)
Bilirubin, Direct: <0.1 mg/dL (ref 0.0–0.2)
Total Bilirubin: 0.5 mg/dL (ref 0.3–1.2)
Total Protein: 7 g/dL (ref 6.5–8.1)

## 2023-03-21 LAB — BASIC METABOLIC PANEL
Anion gap: 8 (ref 5–15)
BUN: 13 mg/dL (ref 6–20)
CO2: 25 mmol/L (ref 22–32)
Calcium: 8.8 mg/dL — ABNORMAL LOW (ref 8.9–10.3)
Chloride: 103 mmol/L (ref 98–111)
Creatinine, Ser: 0.77 mg/dL (ref 0.61–1.24)
GFR, Estimated: >60 mL/min (ref >60)
Glucose, Bld: 144 mg/dL — ABNORMAL HIGH (ref 70–99)
Potassium: 3.8 mmol/L (ref 3.5–5.1)
Sodium: 136 mmol/L (ref 135–145)

## 2023-03-21 LAB — TROPONIN I (HIGH SENSITIVITY): Troponin I (High Sensitivity): <2 ng/L (ref <18)

## 2023-03-21 LAB — CBG MONITORING, ED: Glucose-Capillary: 202 mg/dL — ABNORMAL HIGH (ref 70–99)

## 2023-03-21 LAB — LIPASE, BLOOD: Lipase: 36 U/L (ref 11–51)

## 2023-03-21 MED ORDER — ONDANSETRON HCL 4 MG/2ML IJ SOLN
4.0000 mg | Freq: Once | INTRAMUSCULAR | Status: AC
  Administered 2023-03-21: 4 mg via INTRAVENOUS
  Filled 2023-03-21: qty 2

## 2023-03-21 MED ORDER — SODIUM CHLORIDE 0.9 % IV BOLUS
1000.0000 mL | Freq: Once | INTRAVENOUS | Status: AC
  Administered 2023-03-21: 1000 mL via INTRAVENOUS

## 2023-03-21 MED ORDER — MORPHINE SULFATE (PF) 4 MG/ML IV SOLN
4.0000 mg | Freq: Once | INTRAVENOUS | Status: AC
  Administered 2023-03-21: 4 mg via INTRAVENOUS
  Filled 2023-03-21: qty 1

## 2023-03-21 NOTE — ED Provider Notes (Signed)
[place] EMERGENCY DEPARTMENT AT [facility] Provider Note   CSN: [id number] Arrival date & time: 03/21/23  5284     History  Chief Complaint  Patient presents with   Chest Pain   Back Pain    Jonathan Morris is a 22 y.o. male.  22 yo M with a chief complaints of epigastric abdominal pain that radiates to the back.  This has been going on for couple hours.  Occurred while he was sitting in the car.  Nothing seems to make it better or worse.  No fevers no vomiting no diarrhea.  Has had a mild cough.   Chest Pain Associated symptoms: back pain   Back Pain Associated symptoms: chest pain        Home Medications Prior to Admission medications   Medication Sig Start Date End Date Taking? Authorizing Provider  FLUoxetine (PROZAC) 20 MG capsule Take 20 mg by mouth daily.   Yes [provider]  glimepiride (AMARYL) 4 MG tablet Take 4 mg by mouth daily with breakfast. 11/25/19  Yes [provider]  LANTUS SOLOSTAR 100 UNIT/ML Solostar Pen Inject 25 Units into the skin daily. 04/02/22  Yes [provider]  losartan (COZAAR) 50 MG tablet Take 50 mg by mouth daily.   Yes [provider]  metFORMIN (GLUCOPHAGE) 1000 MG tablet Take 1,000 mg by mouth daily.   Yes [provider]  OZEMPIC, 0.25 OR 0.5 MG/DOSE, 2 MG/3ML SOPN Inject 0.5 mg into the skin once a week.   Yes [provider]  AMBULATORY NON FORMULARY MEDICATION Medication Name: Gi Cocktail 5-10 cc every 6 hours as needed. Equal parts of Maalox, Liquid Donnatol or Bentyl, Viscous Lidocaine. 04/20/19   Lynann Bologna, MD  ciprofloxacin (CIPRO) 500 MG tablet Take 1 tablet (500 mg total) by mouth every 12 (twelve) hours. Patient not taking: Reported on 03/21/2023 05/29/22   Gareth Eagle, PA-C  omeprazole (PRILOSEC) 20 MG capsule Take 1 capsule (20 mg total) by mouth daily. Patient not taking: Reported on 03/21/2023 02/08/19   Gwyneth Sprout, MD  ondansetron  (ZOFRAN) 4 MG tablet Take 1 tablet (4 mg total) by mouth every 6 (six) hours. Patient not taking: Reported on 03/21/2023 12/15/16   Emi Holes, PA-C  pantoprazole (PROTONIX) 40 MG tablet Take 1 tablet (40 mg total) by mouth daily. 1/2 hour before breakfast. Patient not taking: Reported on 03/21/2023 02/10/19   Lynann Bologna, MD  pantoprazole (PROTONIX) 40 MG tablet Take 1 tablet (40 mg total) by mouth 2 (two) times daily. Patient not taking: Reported on 03/21/2023 02/16/20   Lynann Bologna, MD  prochlorperazine (COMPAZINE) 10 MG tablet Take 1 tablet (10 mg total) by mouth 2 (two) times daily as needed for nausea or vomiting. Patient not taking: Reported on 03/21/2023 08/18/19   Lynann Bologna, MD  sucralfate (CARAFATE) 1 g tablet Take 1 tablet (1 g total) by mouth 4 (four) times daily -  with meals and at bedtime. Patient not taking: Reported on 03/21/2023 02/08/19   Gwyneth Sprout, MD      Allergies    Patient has no known allergies.    Review of Systems   Review of Systems  Cardiovascular:  Positive for chest pain.  Musculoskeletal:  Positive for back pain.    Physical Exam Updated Vital Signs BP 127/81   Pulse 91   Temp 98 F (36.7 C) (Oral)   Resp 16   Ht 5\' 11"  (1.803 m)   Wt (!) 158.8  kg   SpO2 92%   BMI 48.82 kg/m  Physical Exam Vitals and nursing note reviewed.  Constitutional:      Appearance: He is well-developed.  HENT:     Head: Normocephalic and atraumatic.  Eyes:     Pupils: Pupils are equal, round, and reactive to light.  Neck:     Vascular: No JVD.  Cardiovascular:     Rate and Rhythm: Normal rate and regular rhythm.     Heart sounds: No murmur heard.    No friction rub. No gallop.  Pulmonary:     Effort: No respiratory distress.     Breath sounds: No wheezing.  Abdominal:     General: There is no distension.     Tenderness: There is abdominal tenderness. There is no guarding or rebound.     Comments: Mild diffuse upper abdominal discomfort without  obvious focality.  Musculoskeletal:        General: Normal range of motion.     Cervical back: Normal range of motion and neck supple.  Skin:    Coloration: Skin is not pale.     Findings: No rash.  Neurological:     Mental Status: He is alert and oriented to person, place, and time.  Psychiatric:        Behavior: Behavior normal.     ED Results / Procedures / Treatments   Labs (all labs ordered are listed, but only abnormal results are displayed) Labs Reviewed  CBC - Abnormal; Notable for the following components:      Result Value   WBC 10.6 (*)    All other components within normal limits  BASIC METABOLIC PANEL - Abnormal; Notable for the following components:   Glucose, Bld 144 (*)    Calcium 8.8 (*)    All other components within normal limits  CBG MONITORING, ED - Abnormal; Notable for the following components:   Glucose-Capillary 202 (*)    All other components within normal limits  LIPASE, BLOOD  HEPATIC FUNCTION PANEL  TROPONIN I (HIGH SENSITIVITY)  TROPONIN I (HIGH SENSITIVITY)    EKG EKG Interpretation  Date/Time:  Saturday March 21 2023 00:39:48 EDT Ventricular Rate:  105 PR Interval:  142 QRS Duration: 98 QT Interval:  324 QTC Calculation: 429 R Axis:   30 Text Interpretation: Sinus tachycardia Inferior infarct, old Baseline wander TECHNICALLY DIFFICULT Otherwise no significant change Confirmed by Melene Plan [phone]) on 03/21/2023 1:14:09 AM  Radiology DG Chest 2 View  Result Date: 03/21/2023 CLINICAL DATA:  Central chest pain radiating to back. EXAM: CHEST - 2 VIEW COMPARISON:  11/24/2022. FINDINGS: The heart size and mediastinal contours are within normal limits. No consolidation, effusion, or pneumothorax. No acute osseous abnormality. IMPRESSION: No active cardiopulmonary disease. Electronically Signed   By: Thornell Sartorius M.D.   On: 03/21/2023 01:18    Procedures Procedures    Medications Ordered in ED Medications  morphine (PF) 4 MG/ML  injection 4 mg (4 mg Intravenous Given 03/21/23 0232)  ondansetron (ZOFRAN) injection 4 mg (4 mg Intravenous Given 03/21/23 0232)  sodium chloride 0.9 % bolus 1,000 mL (1,000 mLs Intravenous New Bag/Given 03/21/23 0232)    ED Course/ Medical Decision Making/ A&P                             Medical Decision Making Amount and/or Complexity of Data Reviewed Labs: ordered.  Risk Prescription drug management.   22 yo M with a chief  complaints of upper abdominal discomfort that radiates to the back.  This has been going on for a couple hours. Will obtain labs.   Patient initially had triage orders for chest discomfort.  Chest x-ray and troponin were ordered.  Chest x-ray independently interpreted by me without focal infiltrates or pneumothorax.  LFT and lipase unremarkable.  Trop negative.    Patient feeling better, will d/c home.   4:50 AM:  I have discussed the diagnosis/risks/treatment options with the patient.  Evaluation and diagnostic testing in the emergency department does not suggest an emergent condition requiring admission or immediate intervention beyond what has been performed at this time.  They will follow up with PCP. We also discussed returning to the ED immediately if new or worsening sx occur. We discussed the sx which are most concerning (e.g., sudden worsening pain, fever, inability to tolerate by mouth) that necessitate immediate return. Medications administered to the patient during their visit and any new prescriptions provided to the patient are listed below.  Medications given during this visit Medications  morphine (PF) 4 MG/ML injection 4 mg (4 mg Intravenous Given 03/21/23 0232)  ondansetron (ZOFRAN) injection 4 mg (4 mg Intravenous Given 03/21/23 0232)  sodium chloride 0.9 % bolus 1,000 mL (1,000 mLs Intravenous New Bag/Given 03/21/23 0232)     The patient appears reasonably screen and/or stabilized for discharge and I doubt any other medical condition or other  [facility] requiring further screening, evaluation, or treatment in the ED at this time prior to discharge.          Final Clinical Impression(s) / ED Diagnoses Final diagnoses:  Epigastric pain    Rx / DC Orders ED Discharge Orders     None         Melene Plan, DO 03/21/23 1610

## 2023-03-21 NOTE — ED Triage Notes (Signed)
Patient in today reporting central chest pain radiating to the back. Hx of type 2 diabetes.

## 2023-03-21 NOTE — Discharge Instructions (Signed)
Try pepcid or tagamet up to twice a day.  Try to avoid things that may make this worse, most commonly these are spicy foods tomato based products fatty foods chocolate and peppermint.  Alcohol and tobacco can also make this worse.  Return to the emergency department for sudden worsening pain fever or inability to eat or drink.  

## 2024-04-12 ENCOUNTER — Emergency Department (HOSPITAL_BASED_OUTPATIENT_CLINIC_OR_DEPARTMENT_OTHER)

## 2024-04-12 ENCOUNTER — Emergency Department (HOSPITAL_BASED_OUTPATIENT_CLINIC_OR_DEPARTMENT_OTHER)
Admission: EM | Admit: 2024-04-12 | Discharge: 2024-04-12 | Disposition: A | Discharge status: Home / Self Care | Payer: Worker's Compensation | Attending: Emergency Medicine | Admitting: Emergency Medicine

## 2024-04-12 ENCOUNTER — Other Ambulatory Visit: Payer: Self-pay

## 2024-04-12 ENCOUNTER — Encounter (HOSPITAL_BASED_OUTPATIENT_CLINIC_OR_DEPARTMENT_OTHER): Payer: Self-pay | Admitting: Radiology

## 2024-04-12 DIAGNOSIS — Z7984 Long term (current) use of oral hypoglycemic drugs: Secondary | ICD-10-CM | POA: Diagnosis not present

## 2024-04-12 DIAGNOSIS — I10 Essential (primary) hypertension: Secondary | ICD-10-CM | POA: Diagnosis not present

## 2024-04-12 DIAGNOSIS — Z87891 Personal history of nicotine dependence: Secondary | ICD-10-CM | POA: Diagnosis not present

## 2024-04-12 DIAGNOSIS — E119 Type 2 diabetes mellitus without complications: Secondary | ICD-10-CM | POA: Diagnosis not present

## 2024-04-12 DIAGNOSIS — Y990 Civilian activity done for income or pay: Secondary | ICD-10-CM | POA: Insufficient documentation

## 2024-04-12 DIAGNOSIS — W1849XA Other slipping, tripping and stumbling without falling, initial encounter: Secondary | ICD-10-CM | POA: Insufficient documentation

## 2024-04-12 DIAGNOSIS — Z79899 Other long term (current) drug therapy: Secondary | ICD-10-CM | POA: Diagnosis not present

## 2024-04-12 DIAGNOSIS — M25562 Pain in left knee: Secondary | ICD-10-CM | POA: Insufficient documentation

## 2024-04-12 MED ORDER — IBUPROFEN 400 MG PO TABS
600.0000 mg | ORAL_TABLET | Freq: Once | ORAL | Status: AC
  Administered 2024-04-12: 600 mg via ORAL
  Filled 2024-04-12: qty 1

## 2024-04-12 MED ORDER — CELECOXIB 200 MG PO CAPS: 200.0000 mg | ORAL_CAPSULE | Freq: Two times a day (BID) | ORAL | 0 refills | Status: AC | PRN

## 2024-04-12 NOTE — Discharge Instructions (Addendum)
 Your x-ray did not show any obvious fracture or dislocation but there is concerned that you have ligament damage inside your knee from the incident today.  Wear the knee brace provided in the ED and try not to bend you knee.  Use crutches as needed to help aid in getting around with.  You may take anti-inflammatories for pain as well as ice your knee.  Recommend calling number attached your discharge papers to set up an appointment with the orthopedic provider for reassessment.

## 2024-04-12 NOTE — ED Triage Notes (Signed)
 Pt states he tripped over a pallet at work today and has not been able to put weight on his left knee since the incident happened. Pt unsure if there is swelling to the knee.

## 2024-04-12 NOTE — ED Provider Notes (Signed)
 [place] EMERGENCY DEPARTMENT AT [facility] Provider Note   CSN: [id number] Arrival date & time: 04/12/24  1716     Patient presents with: Knee Pain   Jonathan Morris is a 23 y.o. male.    Knee Pain   23 year old male presents emergency department complaints of left knee pain.  States that he was at work and tripped on a pallet.  States his left knee was forced to the left the patient did not actually fall.  Has had feelings of left knee instability ever since the incident occurred and feelings as if his knee was going to give out on him.  Has been using a cane to help get around with that has not been able to put very much weight on the knee.  Denies any pain/trauma elsewhere.  Past medical history significant for ADHD, diabetes mellitus, fatty liver, GERD, hypertension, obesity  Prior to Admission medications   Medication Sig Start Date End Date Taking? Authorizing Provider  AMBULATORY NON FORMULARY MEDICATION Medication Name: Gi Cocktail 5-10 cc every 6 hours as needed. Equal parts of Maalox, Liquid Donnatol or Bentyl, Viscous Lidocaine . 04/20/19   Charlanne Groom, MD  ciprofloxacin  (CIPRO ) 500 MG tablet Take 1 tablet (500 mg total) by mouth every 12 (twelve) hours. Patient not taking: Reported on 03/21/2023 05/29/22   Robinson, John K, PA-C  FLUoxetine (PROZAC) 20 MG capsule Take 20 mg by mouth daily.    [provider]  glimepiride (AMARYL) 4 MG tablet Take 4 mg by mouth daily with breakfast. 11/25/19   [provider]  LANTUS SOLOSTAR 100 UNIT/ML Solostar Pen Inject 25 Units into the skin daily. 04/02/22   [provider]  losartan (COZAAR) 50 MG tablet Take 50 mg by mouth daily.    [provider]  metFORMIN (GLUCOPHAGE) 1000 MG tablet Take 1,000 mg by mouth daily.    [provider]  omeprazole  (PRILOSEC) 20 MG capsule Take 1 capsule (20 mg total) by mouth daily. Patient not taking: Reported on 03/21/2023 02/08/19    Doretha Folks, MD  ondansetron  (ZOFRAN ) 4 MG tablet Take 1 tablet (4 mg total) by mouth every 6 (six) hours. Patient not taking: Reported on 03/21/2023 12/15/16   Law, Alexandra M, PA-C  OZEMPIC, 0.25 OR 0.5 MG/DOSE, 2 MG/3ML SOPN Inject 0.5 mg into the skin once a week.    [provider]  pantoprazole  (PROTONIX ) 40 MG tablet Take 1 tablet (40 mg total) by mouth daily. 1/2 hour before breakfast. Patient not taking: Reported on 03/21/2023 02/10/19   Charlanne Groom, MD  pantoprazole  (PROTONIX ) 40 MG tablet Take 1 tablet (40 mg total) by mouth 2 (two) times daily. Patient not taking: Reported on 03/21/2023 02/16/20   Charlanne Groom, MD  prochlorperazine  (COMPAZINE ) 10 MG tablet Take 1 tablet (10 mg total) by mouth 2 (two) times daily as needed for nausea or vomiting. Patient not taking: Reported on 03/21/2023 08/18/19   Charlanne Groom, MD  sucralfate  (CARAFATE ) 1 g tablet Take 1 tablet (1 g total) by mouth 4 (four) times daily -  with meals and at bedtime. Patient not taking: Reported on 03/21/2023 02/08/19   Doretha Folks, MD    Allergies: Patient has no known allergies.    Review of Systems  All other systems reviewed and are negative.   Updated Vital Signs BP 131/88 (BP Location: Right Arm)   Pulse (!) 125   Temp (!) 97.4 F (36.3 C) (Temporal)   Resp 20   Ht 5'  11 (1.803 m)   Wt (!) 172.4 kg   SpO2 97%   BMI 53.00 kg/m   Physical Exam Vitals and nursing note reviewed.  Constitutional:      General: He is not in acute distress.    Appearance: He is well-developed.  HENT:     Head: Normocephalic and atraumatic.  Eyes:     Conjunctiva/sclera: Conjunctivae normal.  Cardiovascular:     Rate and Rhythm: Normal rate and regular rhythm.     Heart sounds: No murmur heard. Pulmonary:     Effort: Pulmonary effort is normal. No respiratory distress.     Breath sounds: Normal breath sounds.  Abdominal:     Palpations: Abdomen is soft.     Tenderness: There is no abdominal  tenderness.  Musculoskeletal:        General: No swelling.     Cervical back: Neck supple.     Comments: No real medial or lateral joint line tenderness.  Patient will flex and extend knee.  Patient shows resistance with anterior drawer as well as valgus/valgus stress.  Pedal and posterior tibial pulses 2+ bilaterally.  No overlying tenderness otherwise of bilateral lower extremities.   Skin:    General: Skin is warm and dry.     Capillary Refill: Capillary refill takes less than 2 seconds.  Neurological:     Mental Status: He is alert.  Psychiatric:        Mood and Affect: Mood normal.     (all labs ordered are listed, but only abnormal results are displayed) Labs Reviewed - No data to display  EKG: None  Radiology: No results found.   Procedures   Medications Ordered in the ED  ibuprofen  (ADVIL ) tablet 600 mg (has no administration in time range)                                    Medical Decision Making Amount and/or Complexity of Data Reviewed Radiology: ordered.  Risk Prescription drug management.  Knee pain This patient presents to the ED for concern of knee pain, this involves an extensive number of treatment options, and is a complaint that carries with it a high risk of complications and morbidity.  The differential diagnosis includes fracture, strain/sprain, dislocation, ischemic limb, septic arthritis, cellulitis, sepsis, necrotizing infection, other   Co morbidities that complicate the patient evaluation  See HPI   Additional history obtained:  Additional history obtained from EMR External records from outside source obtained and reviewed including hospital records   Lab Tests:  N/a   Imaging Studies ordered:  I ordered imaging studies including left knee x-ray I independently visualized and interpreted imaging which showed no acute osseous abnormality I agree with the radiologist interpretation   Cardiac Monitoring: /  EKG:  N/a   Consultations Obtained:  N/a   Problem List / ED Course / Critical interventions / Medication management  Left knee pain I ordered medication including Motrin    Reevaluation of the patient after these medicines showed that the patient improved I have reviewed the patients home medicines and have made adjustments as needed   Social Determinants of Health:  Former cigarette use.  Denies illicit drug use.   Test / Admission - Considered:  Left knee pain Vitals signs significant for initial tachycardia which improved while in the ED.SABRA Otherwise within normal range and stable throughout visit. Imaging studies significant for: See above 23 year old male presents  emergency department complaints of left knee pain.  States that he was at work and tripped on a pallet.  States his left knee was forced to the left the patient did not actually fall.  Has had feelings of left knee instability ever since the incident occurred and feelings as if his knee was going to give out on him.  Has been using a cane to help get around with that has not been able to put very much weight on the knee.  Denies any pain/trauma elsewhere. On exam, no producible bony tenderness of left knee.  Able to range the knee fully.  No pulse deficits suggest ischemic limb.  No overlying skin changes concerning for second infectious process.  Lower extremity edema concerning for DVT.  Patient did elicit guarding when attempting to check for joint laxity of left knee.  Possible laxity appreciated anterior drawer.  X-ray obtained by triage staff which was negative for any acute osseous abnormality.  Concern clinically for:SABRA  Will place patient in knee brace, crutches recommend symptomatic therapy as an AVS with close follow-up with orthopedics in the outpatient setting.  Treatment plan discussed with patient and he acknowledges that he was agreeable to said plan.  Patient well-appearing, afebrile in no acute  distress. Worrisome signs and symptoms were discussed with the patient, and the patient acknowledged understanding to return to the ED if noticed. Patient was stable upon discharge.       Final diagnoses:  None    ED Discharge Orders     None          Silver Wonda LABOR, [place] 04/12/24 1914    Elnor Bernarda SQUIBB, DO 04/14/24 1535
